# Patient Record
Sex: Male | Born: 1960 | Race: Black or African American | Hispanic: No | Marital: Married | Smoking: Current every day smoker
Health system: Southern US, Community
[De-identification: ages and names within clinical notes are randomized; demographics above are authoritative.]

## PROBLEM LIST (undated history)

## (undated) DIAGNOSIS — K2950 Unspecified chronic gastritis without bleeding: Secondary | ICD-10-CM

## (undated) DIAGNOSIS — K5730 Diverticulosis of large intestine without perforation or abscess without bleeding: Secondary | ICD-10-CM

## (undated) DIAGNOSIS — E785 Hyperlipidemia, unspecified: Secondary | ICD-10-CM

## (undated) HISTORY — PX: KNEE ARTHROSCOPY: SUR90

## (undated) HISTORY — DX: Diverticulosis of large intestine without perforation or abscess without bleeding: K57.30

## (undated) HISTORY — DX: Unspecified chronic gastritis without bleeding: K29.50

## (undated) HISTORY — DX: Hyperlipidemia, unspecified: E78.5

## (undated) HISTORY — PX: ESOPHAGOGASTRODUODENOSCOPY: SHX1529

---

## 2000-09-24 ENCOUNTER — Emergency Department (HOSPITAL_COMMUNITY): Admission: EM | Admit: 2000-09-24 | Discharge: 2000-09-24 | Payer: Self-pay | Admitting: Emergency Medicine

## 2004-03-15 ENCOUNTER — Other Ambulatory Visit: Payer: Self-pay

## 2007-07-09 ENCOUNTER — Emergency Department (HOSPITAL_COMMUNITY): Admission: EM | Admit: 2007-07-09 | Discharge: 2007-07-09 | Payer: Self-pay | Admitting: Emergency Medicine
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
## 2008-06-21 ENCOUNTER — Emergency Department (HOSPITAL_COMMUNITY): Admission: EM | Admit: 2008-06-21 | Discharge: 2008-06-21 | Payer: Self-pay | Admitting: Emergency Medicine
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
## 2008-11-09 ENCOUNTER — Inpatient Hospital Stay (HOSPITAL_COMMUNITY): Admission: RE | Admit: 2008-11-09 | Discharge: 2008-11-11 | Payer: Self-pay | Admitting: Psychiatry

## 2008-11-09 ENCOUNTER — Ambulatory Visit: Payer: Self-pay | Admitting: Psychiatry

## 2008-11-09 ENCOUNTER — Emergency Department (HOSPITAL_COMMUNITY): Admission: EM | Admit: 2008-11-09 | Discharge: 2008-11-09 | Payer: Self-pay | Admitting: Emergency Medicine

## 2008-11-25 ENCOUNTER — Emergency Department (HOSPITAL_COMMUNITY): Admission: EM | Admit: 2008-11-25 | Discharge: 2008-11-25 | Payer: Self-pay | Admitting: Emergency Medicine

## 2011-02-02 LAB — RAPID URINE DRUG SCREEN, HOSP PERFORMED
Benzodiazepines: NOT DETECTED
Cocaine: POSITIVE — AB

## 2011-02-02 LAB — DIFFERENTIAL
Basophils Absolute: 0 10*3/uL (ref 0.0–0.1)
Basophils Relative: 0 % (ref 0–1)
Eosinophils Absolute: 0.5 10*3/uL (ref 0.0–0.7)
Eosinophils Relative: 5 % (ref 0–5)
Lymphocytes Relative: 30 % (ref 12–46)

## 2011-02-02 LAB — CBC
HCT: 43.2 % (ref 39.0–52.0)
Platelets: 343 10*3/uL (ref 150–400)
RDW: 14.2 % (ref 11.5–15.5)

## 2011-02-02 LAB — POCT I-STAT, CHEM 8
HCT: 46 % (ref 39.0–52.0)
Hemoglobin: 15.6 g/dL (ref 13.0–17.0)
Sodium: 142 mEq/L (ref 135–145)
TCO2: 27 mmol/L (ref 0–100)

## 2011-02-02 LAB — ETHANOL: Alcohol, Ethyl (B): <5 mg/dL (ref 0–10)

## 2011-03-03 NOTE — H&P (Signed)
Darrell Wyatt NO.:  [id number]   MEDICAL RECORD NO.:  [id number]          PATIENT TYPE:  IPS   LOCATION:  0501                          FACILITY:  [facility]   PHYSICIAN:  Geoffery Lyons, M.D.      DATE OF BIRTH:  10-May-1961   DATE OF ADMISSION:  11/09/2008  DATE OF DISCHARGE:                       PSYCHIATRIC ADMISSION ASSESSMENT   HISTORY:  This is a voluntary admission for this 50 year old married  African American male who presented to the emergency department at [facility].  He reported that he had fallen off the wagon,  and he was afraid he might hurt himself.  He indicated in the emergency  room that he had just had a two-day relapse on cocaine.  In fact, it has  actually been closer to three months.  Yesterday he felt like he might  drive his truck off a cliff.  This was due to the fact that he has been  working at the [employer] for about five years.  He missed work the  other day because of using crack, and he felt extremely guilty.  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   PAST PSYCHIATRIC HISTORY:  1. In 1996, he went to something called [facility], in [place].  This      was to help him get off of crack.  He stayed clean he says for      seven years.  2. In 2005, he relapsed again.  He went back to [facility].  3. He relapsed again about three months ago.   SOCIAL HISTORY:  He finished high school.  He has been married once.  He  has three children by his wife, two boys ages 17 and 52 and a daughter  age 42.  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   FAMILY HISTORY:  He denies alcohol and drug history.  He has had a  recent relapse on the crack.   PRIMARY CARE PHYSICIAN:  None.   PAST MEDICAL HISTORY:  None known.   MEDICATIONS:  He is not prescribed any.   ALLERGIES:  No  known drug allergies.   PHYSICAL EXAMINATION:  GENERAL:  He was medically cleared in the  emergency department at [facility].  He had no  remarkable findings on his workup.  VITAL SIGNS:  He is 71-1/2 inches tall, weight 204 pounds, temperature  97.9 degrees, blood pressure 151/71, pulse 70, respirations 18.  EXTREMITIES:  He has a scar on his right knee.  He had knee surgery at  age 52.  MENTAL STATUS EXAM:  Today he is alert and oriented.  He was  appropriately groomed, dressed and nourished.  His speech is a normal  rate, rhythm and tone.  His mood was appropriate to the situation.  His  thought processes are clear, rational and goal-oriented.  He knows he  needs to get back with his regular support group, etc.  Apparently his  sponsor is dying of cancer, and that was another thing that contributed  to his relapse.  He does not feel actively suicidal today, but he is not  ready for discharge yet.  He is not homicidal and he denies auditory or  visual hallucinations.   LABORATORY DATA:  He had no alcohol.  His labs showed no abnormalities,  other than the presence of cocaine.   DIAGNOSIS:  AXIS I.  Cocaine abuse.  AXIS II.  Deferred.  AXIS III.  None.  AXIS IV.  He does have a traffic court date on this upcoming Wednesday.  AXIS V.  34.   PLAN:  Is to admit for safety and stabilization.  Help him work with the  substance abuse people to help identify a new sponsor and he refuses any  antidepressant or antianxiety agents.  He denies a need for these.   The estimated length of stay is two to three days.      Darrell Wyatt, P.A.-C.      Geoffery Lyons, M.D.  Electronically Signed    MD/MEDQ  D:  11/10/2008  T:  11/10/2008  Job:  [id number]

## 2011-03-06 NOTE — Discharge Summary (Signed)
Darrell Wyatt, BANKHEAD NO.:  [id number]   MEDICAL RECORD NO.:  [id number]          PATIENT TYPE:  IPS   LOCATION:  0501                          FACILITY:  [facility]   PHYSICIAN:  Jasmine Pang, M.D. DATE OF BIRTH:  1961-08-20   DATE OF ADMISSION:  11/09/2008  DATE OF DISCHARGE:  11/11/2008                               DISCHARGE SUMMARY   IDENTIFICATION:  This is a voluntary admission for this 50 year old  married African American male, who was admitted on November 09, 2008.   HISTORY OF PRESENT ILLNESS:  The patient presented to the emergency  department at [facility].  He reported that he had  fallen off the wagon and he was afraid he might hurt himself.  He  indicated in the emergency room that he had just had a 2-day relapse on  cocaine, in fact it has actually been closer to 3 months.  Yesterday, he  felt like he might drive his truck off a cliff.  This was due to the  fact that he has been working at the [employer] for about 5 years and  missed work the other day because of crack.  He likes his job and he  said he felt extremely guilty.  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   PAST PSYCHIATRIC HISTORY:  In 1996, he went to see something called the  [facility] in [place].  This was helped him to get off crack.  He stayed  clean, he states for 7 years.  In 2005, he relapsed again.  He went to a  [facility] and then he relapsed about 3 months ago.   FAMILY HISTORY:  The patient denies alcohol and drug history in the  family.   PAST MEDICAL HISTORY:  None known.   MEDICATIONS:  None.   ALLERGIES:  No known drug allergies.   PHYSICAL FINDINGS:  The patient was medically cleared in the emergency  department at [facility].  There were no acute physical or medical  problems noted.   HOSPITAL COURSE:  Upon admission, the patient was started on trazodone  50 mg p.o.  q.h.s. p.r.n. insomnia and Symmetrel 100 mg p.o. b.i.d. for  craving from cocaine.  In individual sessions, he was friendly and  cooperative.  He states he felt he got depressed, but he was coming  down off the cocaine.  He states he enjoys his job and wants to get  back to work as soon as possible.  He stated he knows what he has to do  I just needed to get that messed out of my system.  He stated he  needed to work on school and going to his [employer]  He says this has  been a real wake up call for me.  On November 11, 2008, mental status  was improved.  The patient was less depressed, less anxious.  Affect was  consistent with mood.  There was no suicidal or homicidal ideation.  No  thoughts of self-injurious behavior.  No auditory  or visual  hallucinations.  No paranoia or delusions.  Thoughts were logical and  goal-directed.  Thought content no predominant theme.  Cognitive was  grossly intact.  Insight good.  Judgment good.  Impulse control was  good.  It was felt the patient was ready for discharge.   DISCHARGE DIAGNOSES:  Axis I:  Cocaine dependence with recent relapse.  Axis II:  None.  Axis III:  None.  Axis IV:  Moderate (he does have a traffic court date on this upcoming  Wednesday).  Axis V:  Global assessment of functioning was 50 at discharge.  GAF was  34 upon admission.  GAF highest past year was 65 to 70.   DISCHARGE PLANS:  There was no specific activity level or dietary  restrictions.   POSTHOSPITAL CARE PLANS:  The patient will be involved in the 12-step  support group.  He was also given the number of [facility] to call for an appointment if he felt he needed  psychiatric help in addition to his recovery program.   DISCHARGE MEDICATIONS:  Symmetrel 100 mg p.o. b.i.d. for cocaine  craving.      Jasmine Pang, M.D.  Electronically Signed     BHS/MEDQ  D:  12/02/2008  T:  12/03/2008  Job:  [id number]

## 2011-03-20 ENCOUNTER — Emergency Department (HOSPITAL_COMMUNITY)
Admission: EM | Admit: 2011-03-20 | Discharge: 2011-03-20 | Disposition: A | Discharge status: Home / Self Care | Payer: Self-pay | Attending: Emergency Medicine | Admitting: Emergency Medicine

## 2011-03-20 DIAGNOSIS — A5903 Trichomonal cystitis and urethritis: Secondary | ICD-10-CM | POA: Insufficient documentation

## 2011-06-16 ENCOUNTER — Inpatient Hospital Stay (INDEPENDENT_AMBULATORY_CARE_PROVIDER_SITE_OTHER)
Admission: RE | Admit: 2011-06-16 | Discharge: 2011-06-16 | Disposition: A | Discharge status: Home / Self Care | Payer: Self-pay | Source: Ambulatory Visit | Attending: Emergency Medicine | Admitting: Emergency Medicine

## 2011-06-16 DIAGNOSIS — S335XXA Sprain of ligaments of lumbar spine, initial encounter: Secondary | ICD-10-CM

## 2011-08-30 ENCOUNTER — Emergency Department (INDEPENDENT_AMBULATORY_CARE_PROVIDER_SITE_OTHER): Payer: Self-pay

## 2011-08-30 ENCOUNTER — Encounter: Payer: Self-pay | Admitting: *Deleted

## 2011-08-30 ENCOUNTER — Emergency Department (INDEPENDENT_AMBULATORY_CARE_PROVIDER_SITE_OTHER)
Admission: EM | Admit: 2011-08-30 | Discharge: 2011-08-30 | Disposition: A | Discharge status: Other Acute Inpatient Hospital | Payer: Self-pay | Source: Home / Self Care | Attending: Family Medicine | Admitting: Family Medicine

## 2011-08-30 DIAGNOSIS — M549 Dorsalgia, unspecified: Secondary | ICD-10-CM

## 2011-08-30 LAB — POCT URINALYSIS DIP (DEVICE)
Bilirubin Urine: NEGATIVE
Glucose, UA: NEGATIVE mg/dL
Leukocytes, UA: NEGATIVE
Nitrite: NEGATIVE

## 2011-08-30 IMAGING — CR DG ABDOMEN 1V
2 series · 2 of 2 positions shown · non-contrast
Comparison: Spine films [DATE]

CLINICAL DATA: Left flank pain and back

ABDOMEN - 1 VIEW

[view not recorded (1 of 2)]
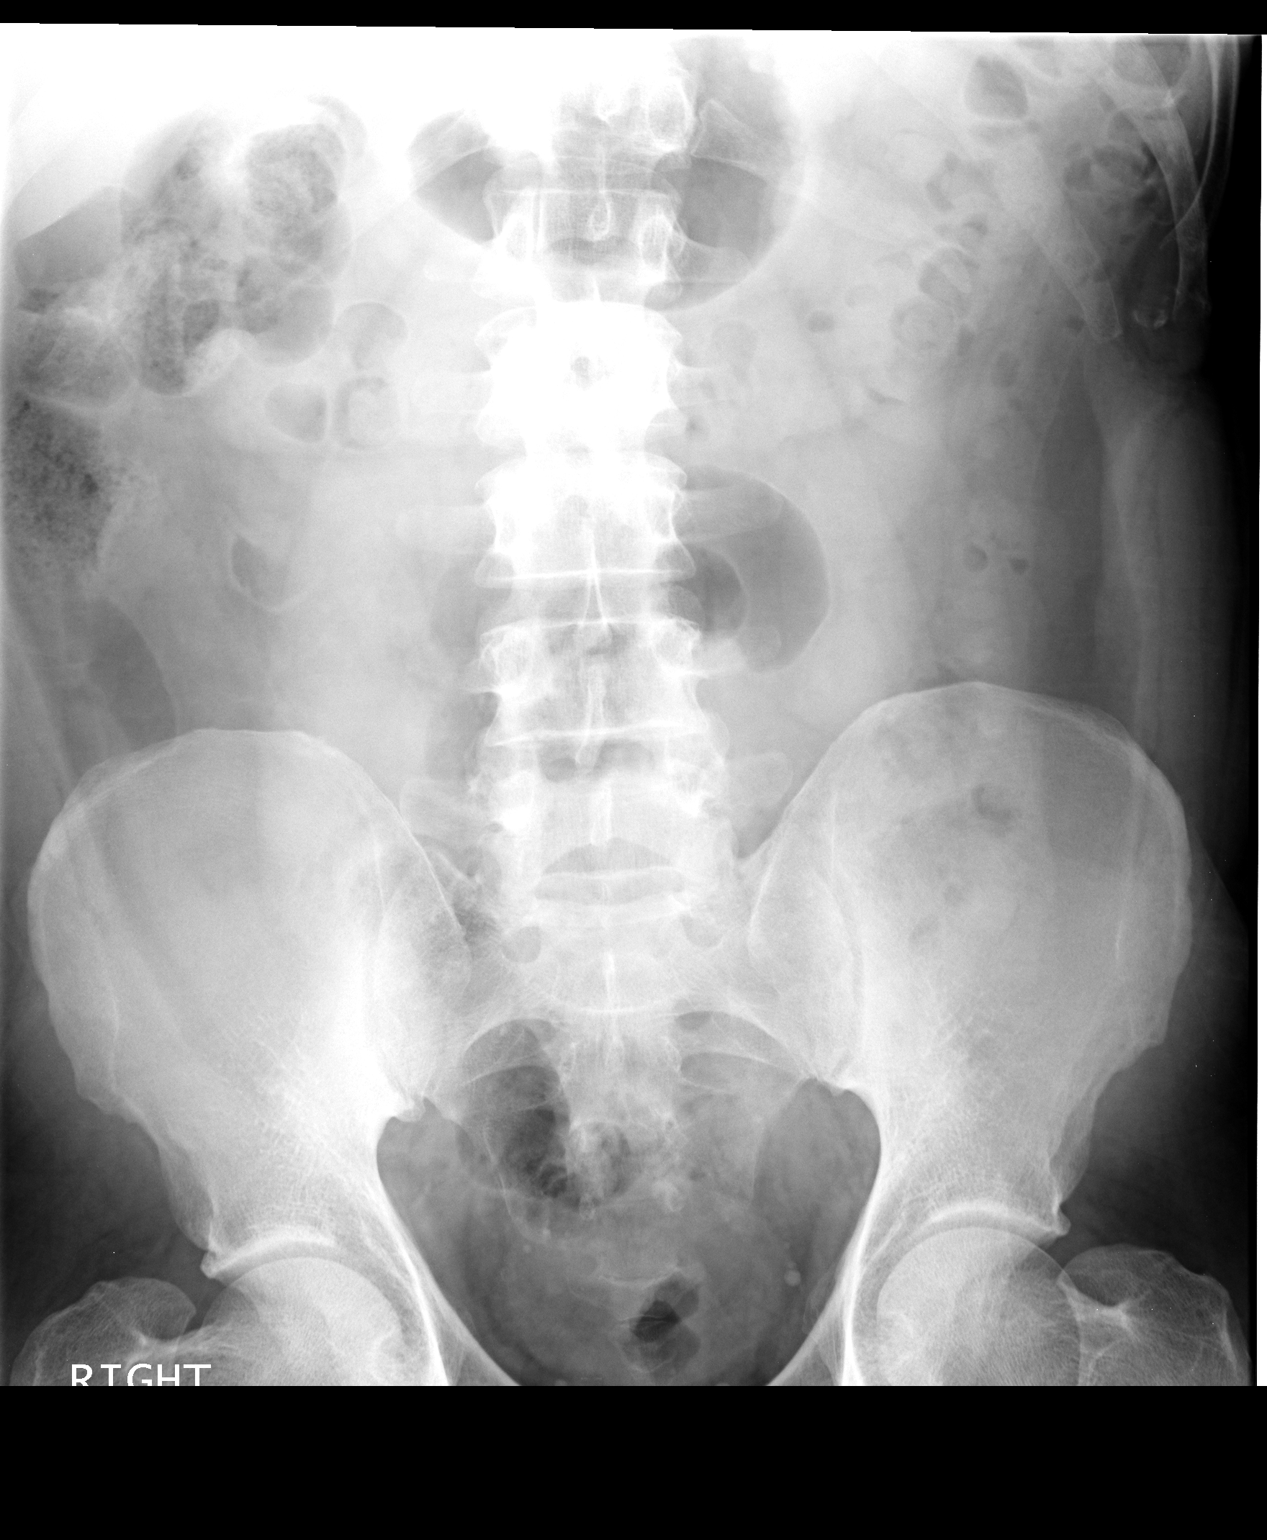

[view not recorded (2 of 2)]
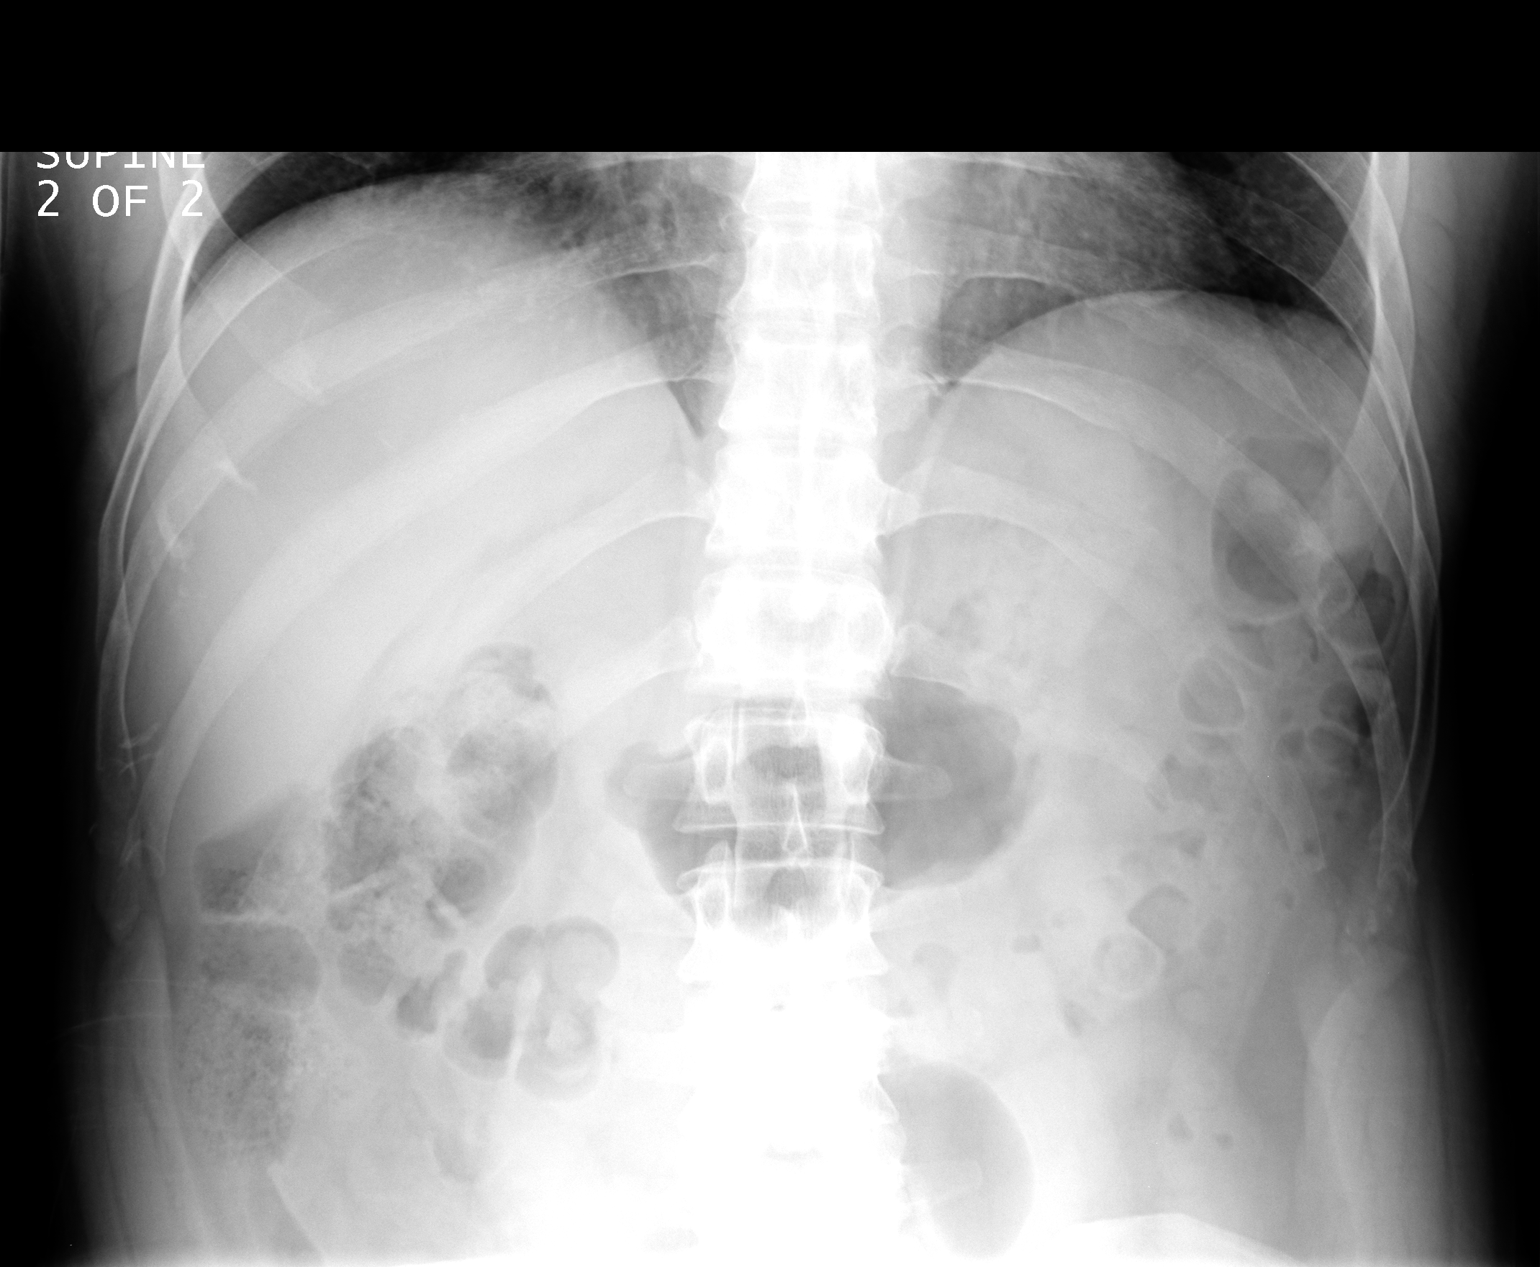

[2 of 2 positions shown; findings below may reference images not displayed]

FINDINGS: There are no dilated loops of large or small bowel.  No
pathologic calcifications are evident.  No osseous abnormality.
IMPRESSION: No acute abdominal process.

## 2011-08-30 IMAGING — CR DG THORACIC SPINE 3V
3 series · 3 of 3 positions shown · non-contrast
Comparison: None.

CLINICAL DATA: Back pain

THORACIC SPINE - 2 VIEW + SWIMMERS

[view not recorded (1 of 3)]
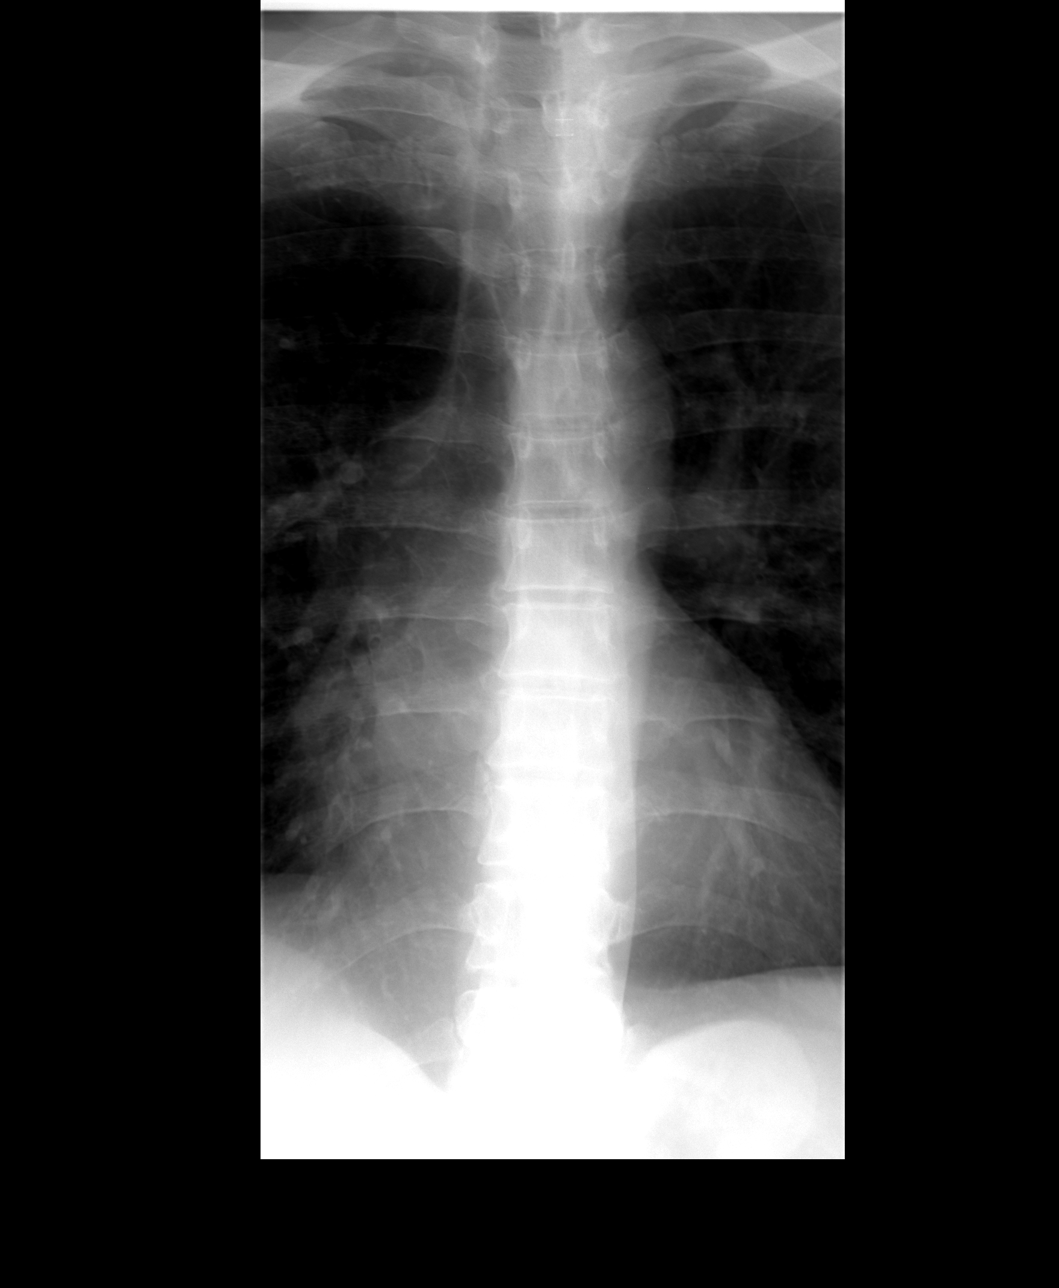

[view not recorded (2 of 3)]
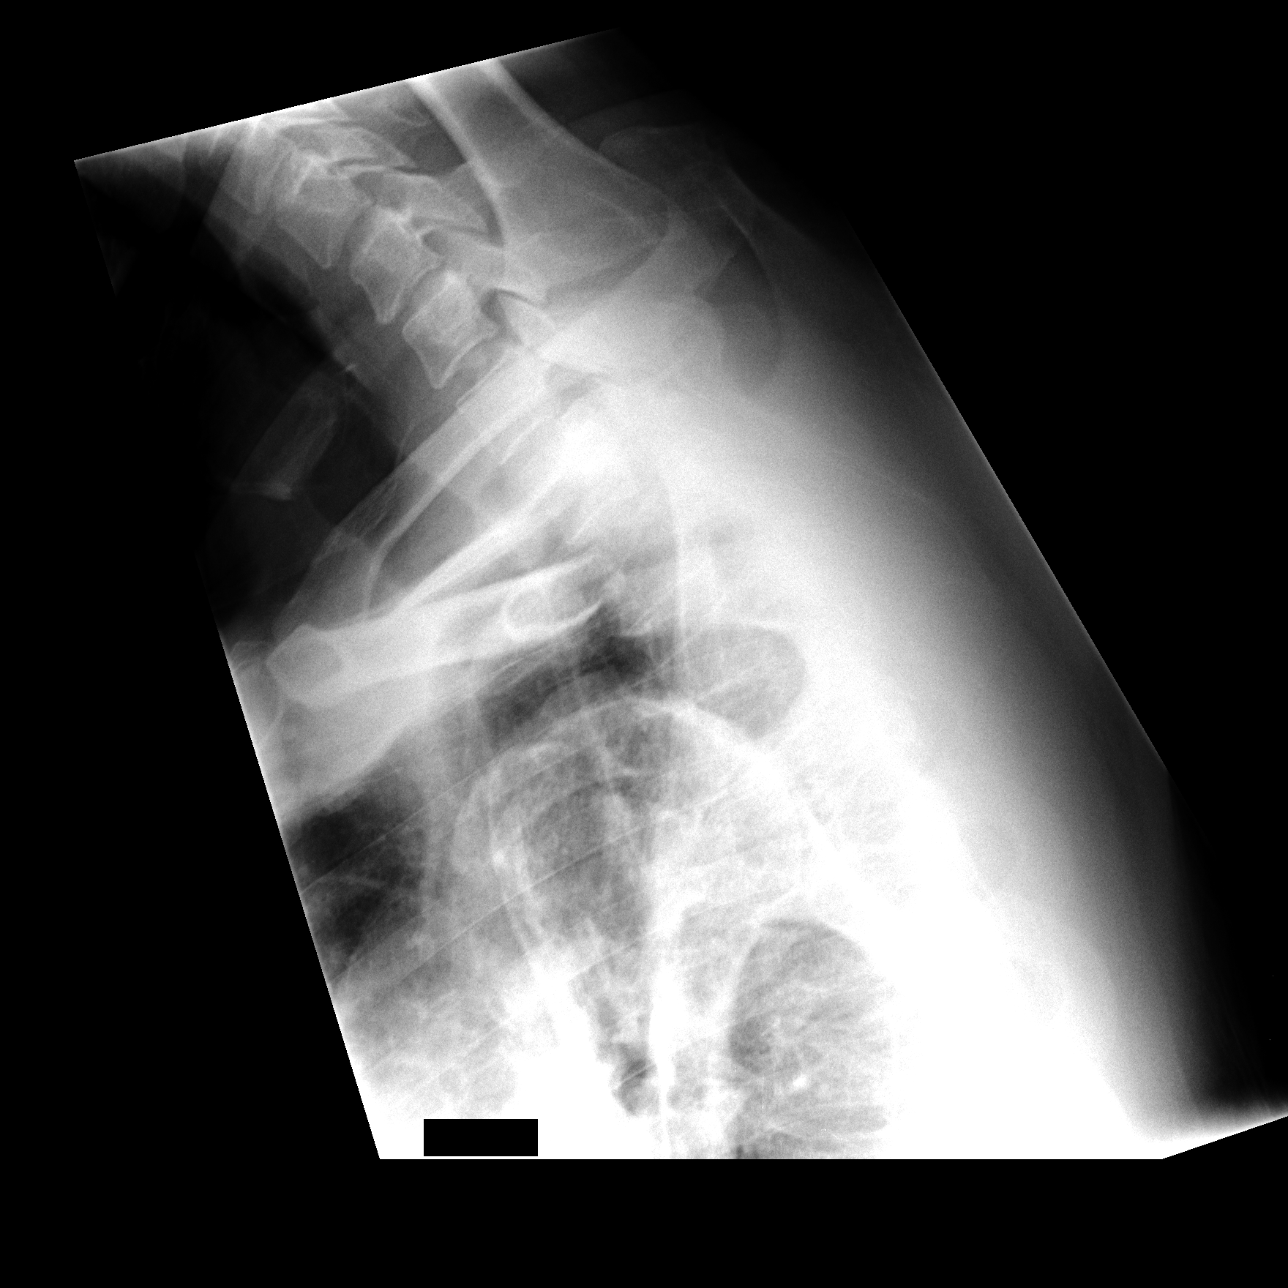

[view not recorded (3 of 3)]
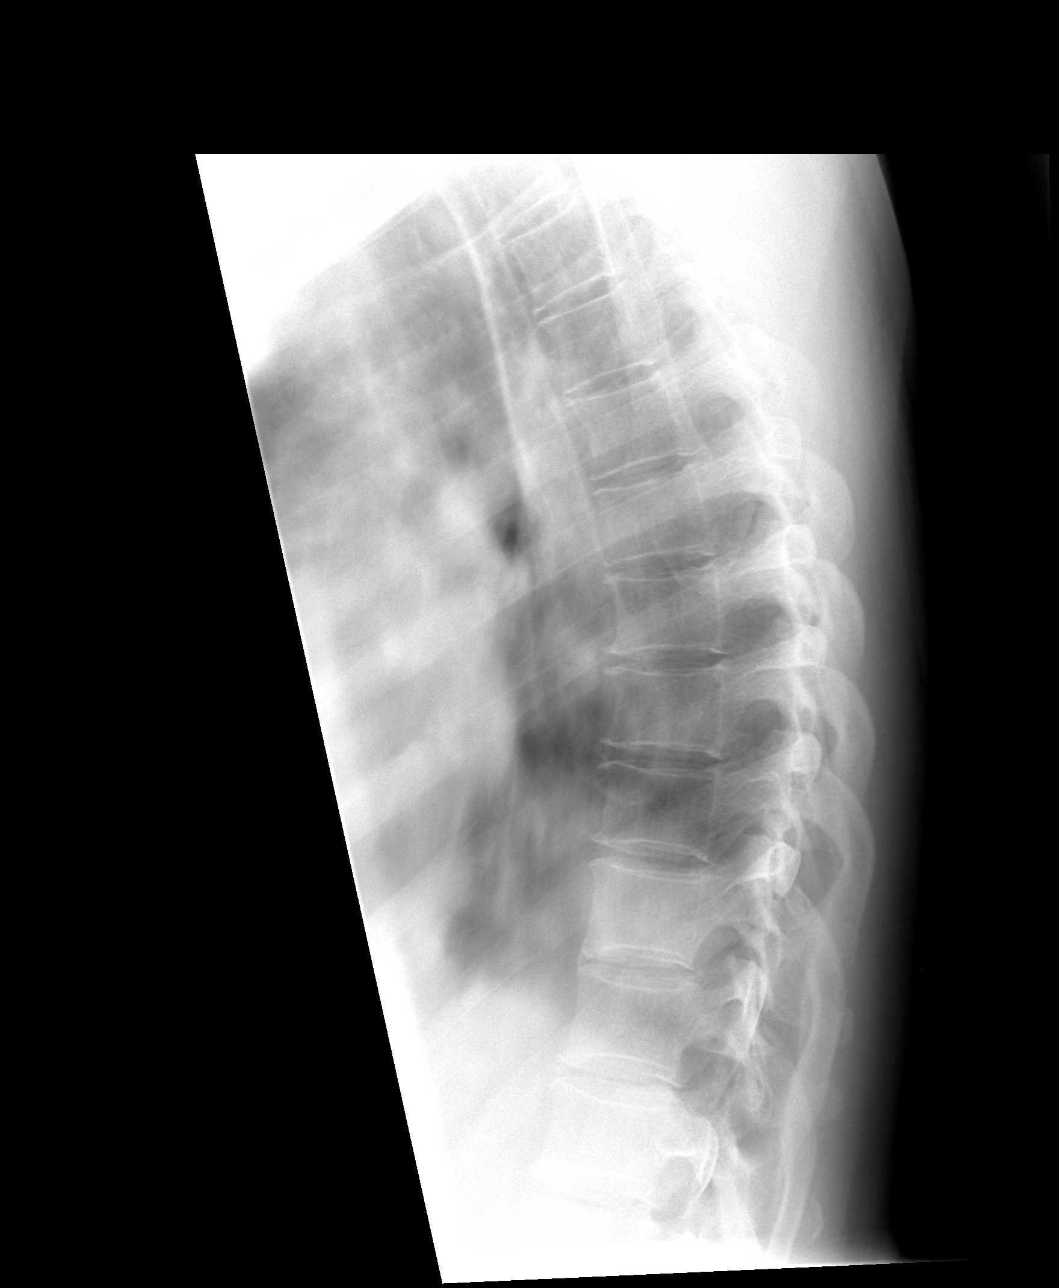

[3 of 3 positions shown; findings below may reference images not displayed]

FINDINGS: Thoracic spine is normal in alignment and position.

No evidence of fracture or dislocation.  The vertebral body heights
are maintained.

Mild multilevel degenerative changes.

The visualized lungs are clear.
IMPRESSION: No fracture or dislocation is seen.

Mild multilevel degenerative changes.

## 2011-08-30 MED ORDER — CYCLOBENZAPRINE HCL 5 MG PO TABS: 5.0000 mg | ORAL_TABLET | Freq: Three times a day (TID) | ORAL | Status: AC | PRN

## 2011-08-30 MED ORDER — NAPROXEN 500 MG PO TABS: 500.0000 mg | ORAL_TABLET | Freq: Two times a day (BID) | ORAL | Status: AC

## 2011-08-30 MED ORDER — HYDROCODONE-ACETAMINOPHEN 5-325 MG PO TABS: ORAL_TABLET | ORAL | Status: AC

## 2011-08-30 NOTE — ED Provider Notes (Addendum)
History     CSN: [id number] Arrival date & time: 08/30/2011  9:28 AM   First MD Initiated Contact with Patient 08/30/11 [phone]      Chief Complaint  Patient presents with  . Flank Pain    pt with c/o left sided back/flank/abdominal pain - denies urinary symptoms - denies constipation - pain increases with movement - unable to sleep at night due to pain x one week   . Abdominal Pain    (Consider location/radiation/quality/duration/timing/severity/associated sxs/prior treatment) Patient is a 50 y.o. male presenting with back pain. The history is provided by the patient.  Back Pain  This is a new problem. The current episode started more than 2 days ago. The problem occurs constantly. The problem has not changed since onset.The pain is associated with no known injury. The pain is present in the lumbar spine. The pain is moderate. The symptoms are aggravated by bending and twisting. Associated symptoms include abdominal pain. Pertinent negatives include no bowel incontinence, no perianal numbness, no bladder incontinence, no paresthesias, no paresis and no tingling.    History reviewed. No pertinent past medical history.  Past Surgical History  Procedure Date  . Knee arthroscopy     History reviewed. No pertinent family history.  History  Substance Use Topics  . Smoking status: Current Everyday Smoker  . Smokeless tobacco: Not on file  . Alcohol Use: No      Review of Systems  Constitutional: Negative.   HENT: Negative.   Eyes: Negative.   Respiratory: Negative.   Cardiovascular: Negative.   Gastrointestinal: Positive for abdominal pain. Negative for diarrhea, constipation and bowel incontinence.  Genitourinary: Negative for bladder incontinence.  Musculoskeletal: Positive for back pain.  Skin: Negative.   Neurological: Negative for tingling and paresthesias.    Allergies  Review of patient's allergies indicates no known allergies.  Home Medications   Current  Outpatient Rx  Name Route Sig Dispense Refill  . IBUPROFEN 800 MG PO TABS Oral Take 800 mg by mouth every 8 (eight) hours as needed.        BP 154/94  Pulse 71  Temp(Src) 98.8 F (37.1 C) (Oral)  Resp 16  SpO2 99%  Physical Exam  Constitutional: He is oriented to person, place, and time. He appears well-developed and well-nourished.  HENT:  Head: Normocephalic and atraumatic.  Eyes: EOM are normal.  Abdominal: Soft. He exhibits no distension. There is tenderness in the left upper quadrant. There is no rebound, no guarding, no CVA tenderness, no tenderness at McBurney's point and negative Murphy's sign.    Musculoskeletal:       Lumbar back: He exhibits tenderness, pain and spasm. He exhibits normal range of motion and no bony tenderness.       Back:  Neurological: He is alert and oriented to person, place, and time.    ED Course  Procedures (including critical care time)  Labs Reviewed  POCT URINALYSIS DIP (DEVICE) - Abnormal; Notable for the following:    Hgb urine dipstick TRACE (*)    All other components within normal limits  POCT URINALYSIS DIPSTICK   No results found.   No diagnosis found.    MDM  Labs reviewed; xray reviewed; no acute findings.        Richardo Priest, MD 08/30/11 1145  Renaee Munda, MD 02/18/12 1534

## 2011-08-30 NOTE — ED Notes (Signed)
Dr Juanetta Gosling in talking with pt

## 2012-01-24 ENCOUNTER — Emergency Department (INDEPENDENT_AMBULATORY_CARE_PROVIDER_SITE_OTHER)
Admission: EM | Admit: 2012-01-24 | Discharge: 2012-01-24 | Disposition: A | Discharge status: Home / Self Care | Payer: Self-pay | Source: Home / Self Care | Attending: Emergency Medicine | Admitting: Emergency Medicine

## 2012-01-24 ENCOUNTER — Encounter (HOSPITAL_COMMUNITY): Payer: Self-pay | Admitting: *Deleted

## 2012-01-24 DIAGNOSIS — K047 Periapical abscess without sinus: Secondary | ICD-10-CM

## 2012-01-24 MED ORDER — PENICILLIN V POTASSIUM 500 MG PO TABS: 500.0000 mg | ORAL_TABLET | Freq: Three times a day (TID) | ORAL | Status: AC

## 2012-01-24 MED ORDER — HYDROCODONE-ACETAMINOPHEN 5-325 MG PO TABS: 2.0000 | ORAL_TABLET | ORAL | Status: AC | PRN

## 2012-01-24 NOTE — Discharge Instructions (Signed)
Abscessed Tooth A tooth abscess is a collection of infected fluid (pus) from a bacterial infection in the inner part of the tooth (pulp). It usually occurs at the end of the tooth's root.  CAUSES   A very bad cavity (extensive tooth decay).   Trauma to the tooth, such as a broken or chipped tooth, that allows bacteria to enter into the pulp.  SYMPTOMS  Severe pain in and around the infected tooth.   Swelling and redness around the abscessed tooth or in the mouth or face.   Tenderness.   Pus drainage.   Bad breath.   Bitter taste in the mouth.   Difficulty swallowing.   Difficulty opening the mouth.   Feeling sick to your stomach (nauseous).   Vomiting.   Chills.   Swollen neck glands.  DIAGNOSIS  A medical and dental history will be taken.   An examination will be performed by tapping on the abscessed tooth.   X-rays may be taken of the tooth to identify the abscess.  TREATMENT The goal of treatment is to eliminate the infection.   You may be prescribed antibiotic medicine to stop the infection from spreading.   A root canal may be performed to save the tooth. If the tooth cannot be saved, it may be pulled (extracted) and the abscess may be drained.  HOME CARE INSTRUCTIONS  Only take over-the-counter or prescription medicines for pain, fever, or discomfort as directed by your caregiver.   Do not drive after taking pain medicine (narcotics).   Rinse your mouth (gargle) often with salt water ( tsp salt in 8 oz of warm water) to relieve pain or swelling.   Do not apply heat to the outside of your face.   Return to your dentist for further treatment as directed.  SEEK IMMEDIATE DENTAL CARE IF:  You have a temperature by mouth above 102 F (38.9 C), not controlled by medicine.   You have chills or a very bad headache.   You have problems breathing or swallowing.   Your have trouble opening your mouth.   You develop swelling in the neck or around the eye.    Your pain is not helped by medicine.   Your pain is getting worse instead of better.  Document Released: 10/05/2005 Document Revised: 09/24/2011 Document Reviewed: 01/13/2011 [facility] Patient Information 2012 [place], [place].Abscessed Tooth A tooth abscess is a collection of infected fluid (pus) from a bacterial infection in the inner part of the tooth (pulp). It usually occurs at the end of the tooth's root.  CAUSES   A very bad cavity (extensive tooth decay).   Trauma to the tooth, such as a broken or chipped tooth, that allows bacteria to enter into the pulp.  SYMPTOMS  Severe pain in and around the infected tooth.   Swelling and redness around the abscessed tooth or in the mouth or face.   Tenderness.   Pus drainage.   Bad breath.   Bitter taste in the mouth.   Difficulty swallowing.   Difficulty opening the mouth.   Feeling sick to your stomach (nauseous).   Vomiting.   Chills.   Swollen neck glands.  DIAGNOSIS  A medical and dental history will be taken.   An examination will be performed by tapping on the abscessed tooth.   X-rays may be taken of the tooth to identify the abscess.  TREATMENT The goal of treatment is to eliminate the infection.   You may be prescribed antibiotic medicine to stop  the infection from spreading.   A root canal may be performed to save the tooth. If the tooth cannot be saved, it may be pulled (extracted) and the abscess may be drained.  HOME CARE INSTRUCTIONS  Only take over-the-counter or prescription medicines for pain, fever, or discomfort as directed by your caregiver.   Do not drive after taking pain medicine (narcotics).   Rinse your mouth (gargle) often with salt water ( tsp salt in 8 oz of warm water) to relieve pain or swelling.   Do not apply heat to the outside of your face.   Return to your dentist for further treatment as directed.  SEEK IMMEDIATE DENTAL CARE IF:  You have a temperature by mouth above  102 F (38.9 C), not controlled by medicine.   You have chills or a very bad headache.   You have problems breathing or swallowing.   Your have trouble opening your mouth.   You develop swelling in the neck or around the eye.   Your pain is not helped by medicine.   Your pain is getting worse instead of better.  Document Released: 10/05/2005 Document Revised: 09/24/2011 Document Reviewed: 01/13/2011 [facility] Patient Information 2012 [place], [place].

## 2012-01-24 NOTE — ED Notes (Signed)
Co pain and swelling of gum at right upper molar area, has been swelling x 1 wk, has taken ibuprofen with little improvement.

## 2012-01-24 NOTE — ED Provider Notes (Signed)
History     CSN: [id number]  Arrival date & time 01/24/12  7829   First MD Initiated Contact with Patient 01/24/12 [phone]      Chief Complaint  Patient presents with  . Oral Swelling    (Consider location/radiation/quality/duration/timing/severity/associated sxs/prior treatment) HPI Comments: Patient presents urgent care today complaining of ongoing upper gum pain and swelling that has been persistent for about a week. Patient haven't called his dentist that supplies him to present to urgent care. Denies any fevers. In mild facial swelling right upper zygomatic area.  Patient is a 51 y.o. male presenting with tooth pain. The history is provided by the patient.  Dental PainPrimary symptoms do not include mouth pain, fever or cough. The symptoms began 5 to 7 days ago. The symptoms are worsening.  Additional symptoms include: gum swelling. Additional symptoms do not include: facial swelling, drooling and fatigue.    History reviewed. No pertinent past medical history.  Past Surgical History  Procedure Date  . Knee arthroscopy     History reviewed. No pertinent family history.  History  Substance Use Topics  . Smoking status: Current Everyday Smoker  . Smokeless tobacco: Not on file  . Alcohol Use: No      Review of Systems  Constitutional: Negative for fever and fatigue.  HENT: Positive for dental problem. Negative for facial swelling, drooling and mouth sores.   Respiratory: Negative for cough.     Allergies  Review of patient's allergies indicates no known allergies.  Home Medications   Current Outpatient Rx  Name Route Sig Dispense Refill  . HYDROCODONE-ACETAMINOPHEN 5-325 MG PO TABS Oral Take 2 tablets by mouth every 4 (four) hours as needed for pain. 15 tablet 0  . IBUPROFEN 800 MG PO TABS Oral Take 800 mg by mouth every 8 (eight) hours as needed.      Marland Kitchen NAPROXEN 500 MG PO TABS Oral Take 1 tablet (500 mg total) by mouth 2 (two) times daily. 30 tablet 0  .  PENICILLIN V POTASSIUM 500 MG PO TABS Oral Take 1 tablet (500 mg total) by mouth 3 (three) times daily. 30 tablet 0    BP 145/88  Pulse 75  Temp(Src) 98.1 F (36.7 C) (Oral)  Resp 17  SpO2 97%  Physical Exam  Nursing note and vitals reviewed. Constitutional: He appears well-developed and well-nourished. No distress.  HENT:  Mouth/Throat: Mucous membranes are normal. No oral lesions. Abnormal dentition. Dental abscesses and dental caries present. No lacerations. No posterior oropharyngeal edema or tonsillar abscesses.  Pulmonary/Chest: Effort normal and breath sounds normal.  Neurological: He is alert.  Skin: Skin is warm. No rash noted. No erythema.    ED Course  Procedures (including critical care time)  Labs Reviewed - No data to display No results found.   1. Abscessed tooth       MDM   Severe gingival disease and potentially early abscess formation on the right upper molar       Jimmie Molly, MD 01/24/12 1712

## 2014-01-24 ENCOUNTER — Emergency Department (HOSPITAL_COMMUNITY)
Admission: EM | Admit: 2014-01-24 | Discharge: 2014-01-24 | Disposition: A | Discharge status: Home / Self Care | Payer: 59 | Source: Home / Self Care | Attending: Emergency Medicine | Admitting: Emergency Medicine

## 2014-01-24 ENCOUNTER — Encounter (HOSPITAL_COMMUNITY): Payer: Self-pay | Admitting: Emergency Medicine

## 2014-01-24 ENCOUNTER — Emergency Department (INDEPENDENT_AMBULATORY_CARE_PROVIDER_SITE_OTHER): Payer: 59

## 2014-01-24 DIAGNOSIS — K5900 Constipation, unspecified: Secondary | ICD-10-CM

## 2014-01-24 IMAGING — CR DG ABDOMEN 2V
2 series · 2 of 2 positions shown · non-contrast
Comparison: DG ABDOMEN 1V dated [DATE]

CLINICAL DATA: Lower abdominal pain with vomiting and diarrhea
since this morning.

EXAM:
ABDOMEN - 2 VIEW

[view not recorded (1 of 2)]
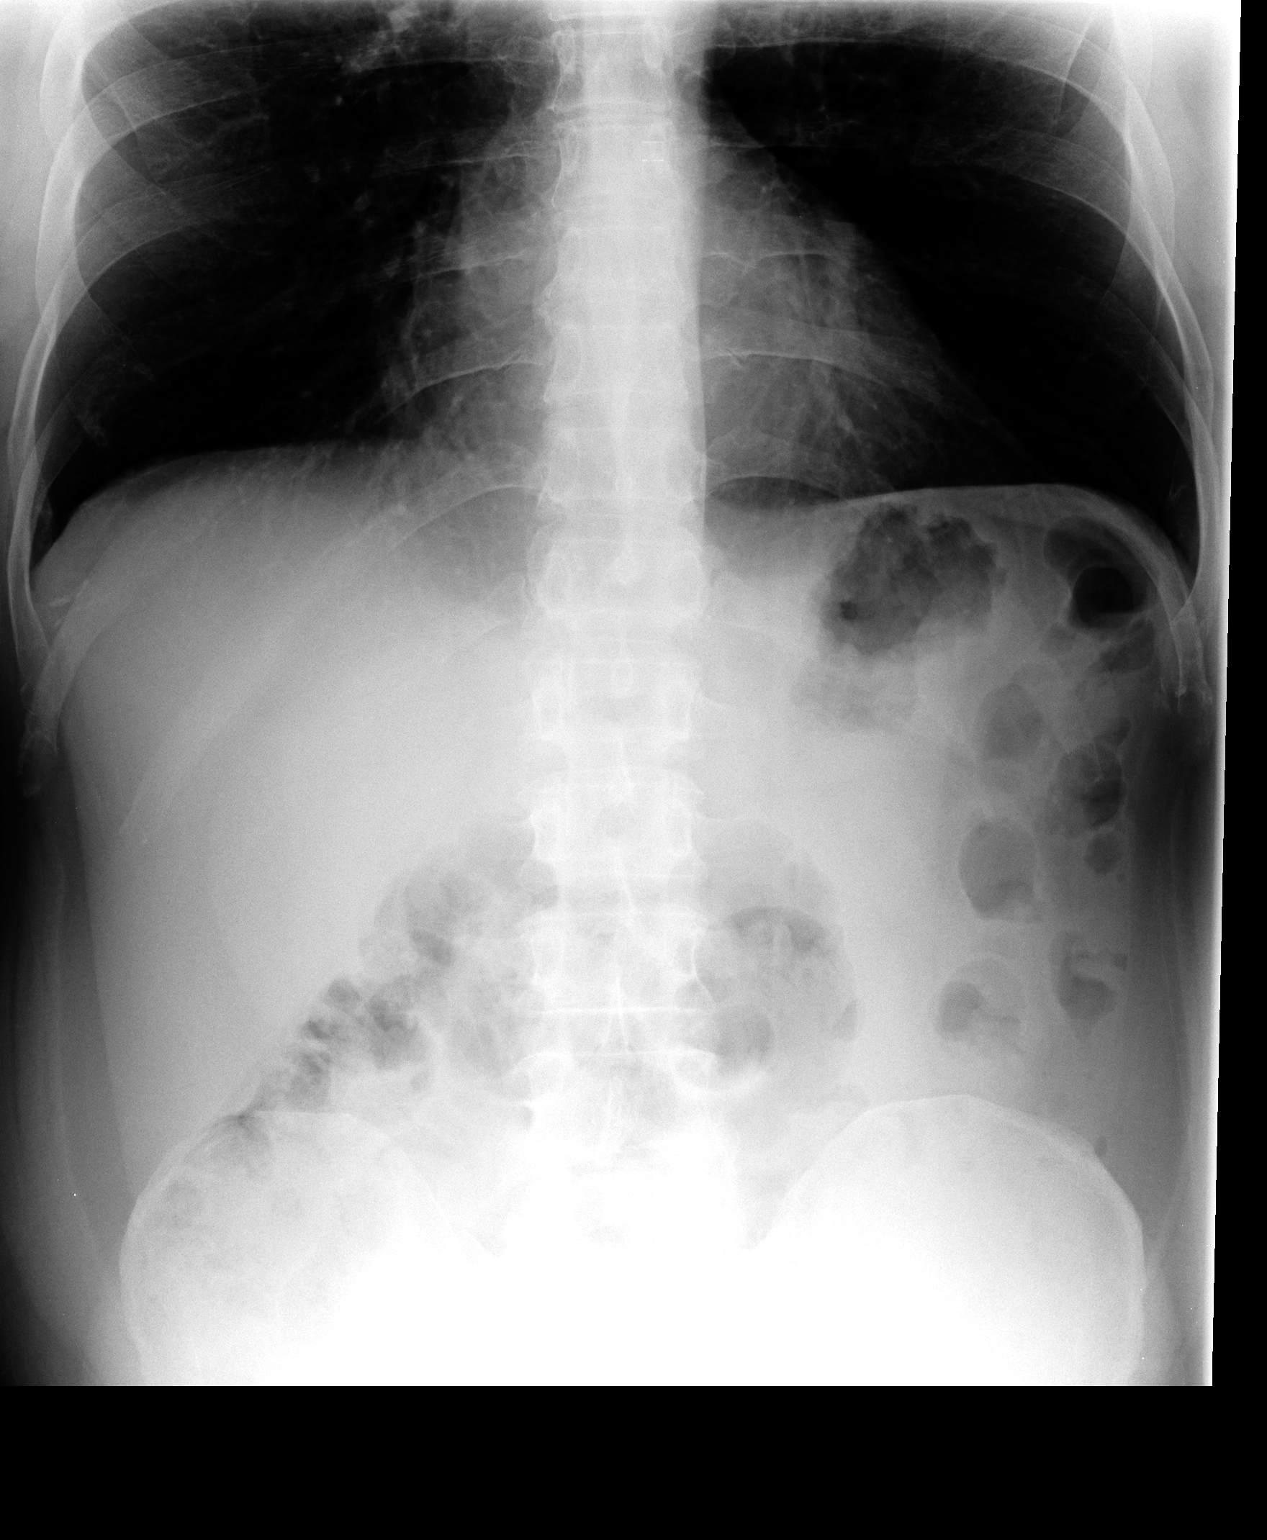

[view not recorded (2 of 2)]
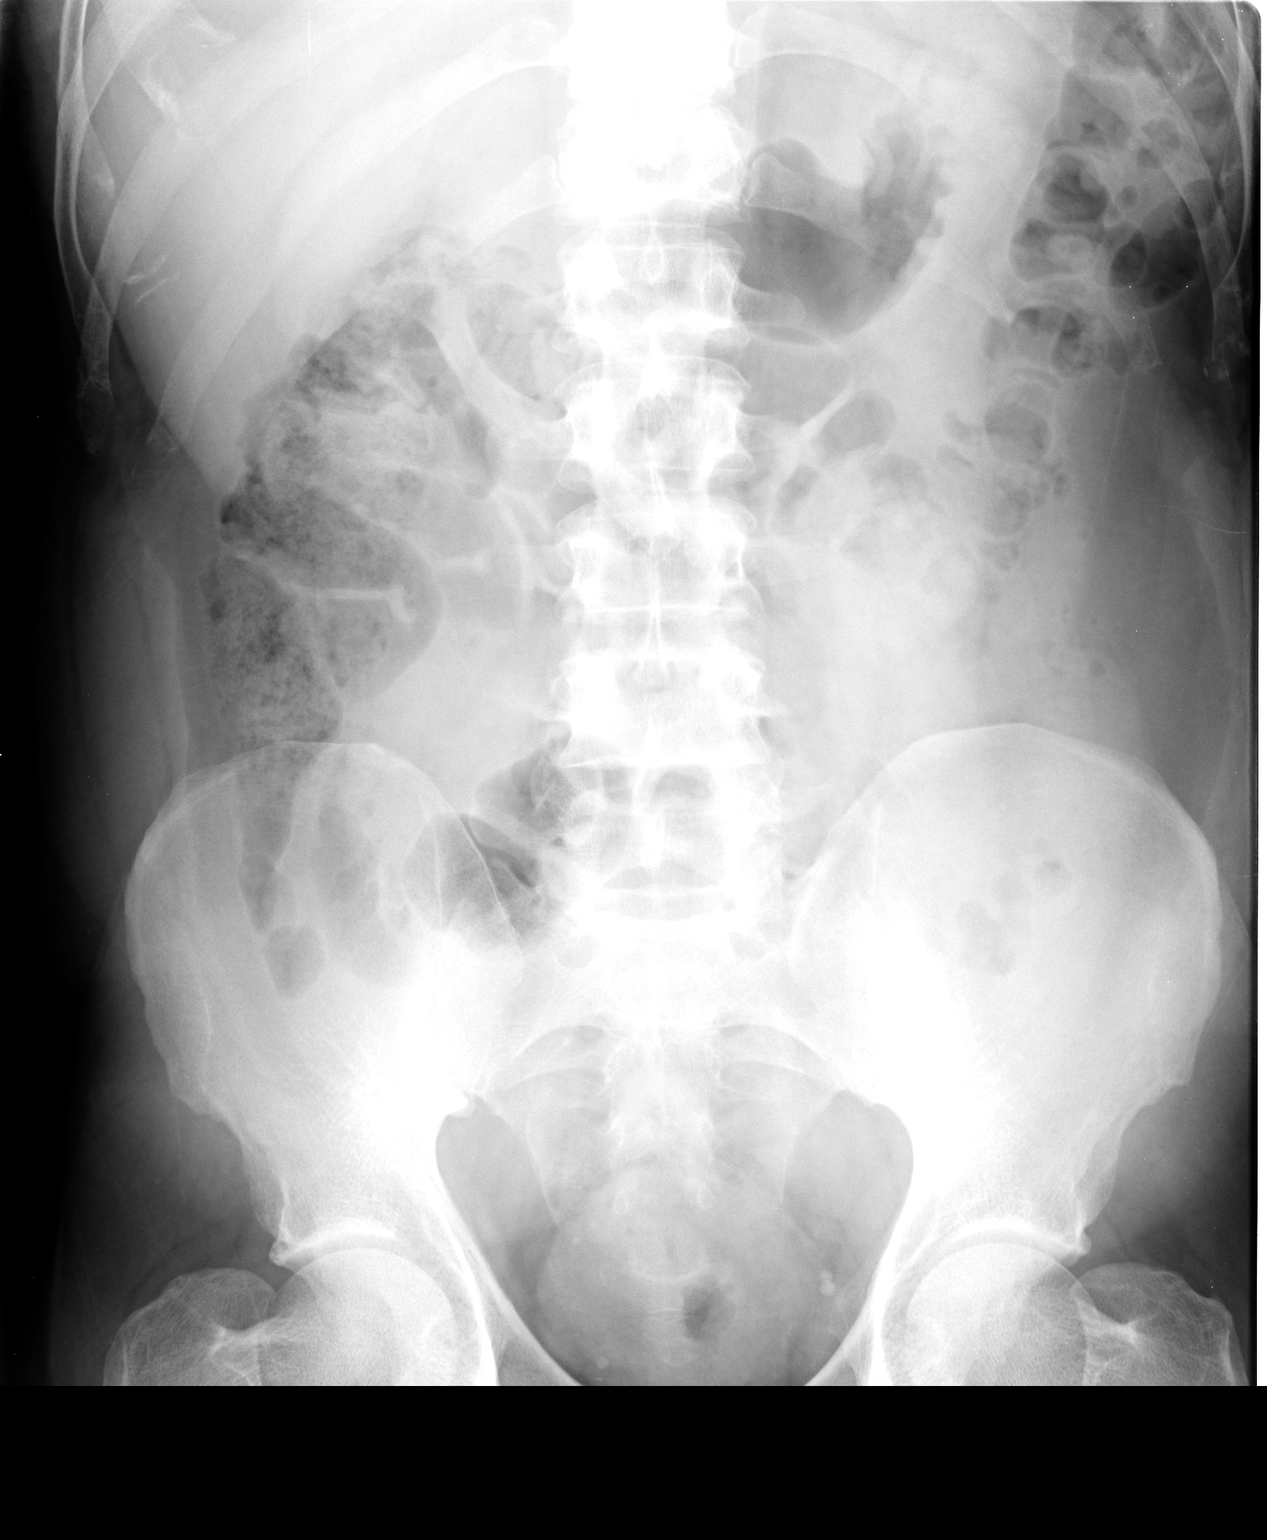

[2 of 2 positions shown; findings below may reference images not displayed]

FINDINGS: The bowel gas pattern is within the limits of normal. There is a
moderate stool burden in the right colon. There is no evidence of
small or large bowel obstruction or ileus. No abnormal soft tissue
calcifications are present. The bony structures are normal in
appearance. The lung bases are clear.
IMPRESSION: No acute intra-abdominal abnormality is demonstrated.

## 2014-01-24 MED ORDER — POLYETHYLENE GLYCOL 3350 17 G PO PACK: 17.0000 g | PACK | Freq: Every day | ORAL | Status: DC

## 2014-01-24 NOTE — Discharge Instructions (Signed)
Your xray suggests moderate constipation which is likely the reason for your discomfort this morning. Please use medication as prescribed to clear some of your stool burden. You should work towards finding a primary care doctor to assist you with routine health maintenance.  Constipation, Adult Constipation is when a person has fewer than 3 bowel movements a week; has difficulty having a bowel movement; or has stools that are dry, hard, or larger than normal. As people grow older, constipation is more common. If you try to fix constipation with medicines that make you have a bowel movement (laxatives), the problem may get worse. Long-term laxative use may cause the muscles of the colon to become weak. A low-fiber diet, not taking in enough fluids, and taking certain medicines may make constipation worse. CAUSES   Certain medicines, such as antidepressants, pain medicine, iron supplements, antacids, and water pills.   Certain diseases, such as diabetes, irritable bowel syndrome (IBS), thyroid disease, or depression.   Not drinking enough water.   Not eating enough fiber-rich foods.   Stress or travel.  Lack of physical activity or exercise.  Not going to the restroom when there is the urge to have a bowel movement.  Ignoring the urge to have a bowel movement.  Using laxatives too much. SYMPTOMS   Having fewer than 3 bowel movements a week.   Straining to have a bowel movement.   Having hard, dry, or larger than normal stools.   Feeling full or bloated.   Pain in the lower abdomen.  Not feeling relief after having a bowel movement. DIAGNOSIS  Your caregiver will take a medical history and perform a physical exam. Further testing may be done for severe constipation. Some tests may include:   A barium enema X-ray to examine your rectum, colon, and sometimes, your small intestine.  A sigmoidoscopy to examine your lower colon.  A colonoscopy to examine your entire  colon. TREATMENT  Treatment will depend on the severity of your constipation and what is causing it. Some dietary treatments include drinking more fluids and eating more fiber-rich foods. Lifestyle treatments may include regular exercise. If these diet and lifestyle recommendations do not help, your caregiver may recommend taking over-the-counter laxative medicines to help you have bowel movements. Prescription medicines may be prescribed if over-the-counter medicines do not work.  HOME CARE INSTRUCTIONS   Increase dietary fiber in your diet, such as fruits, vegetables, whole grains, and beans. Limit high-fat and processed sugars in your diet, such as [place] fries, hamburgers, cookies, candies, and soda.   A fiber supplement may be added to your diet if you cannot get enough fiber from foods.   Drink enough fluids to keep your urine clear or pale yellow.   Exercise regularly or as directed by your caregiver.   Go to the restroom when you have the urge to go. Do not hold it.  Only take medicines as directed by your caregiver. Do not take other medicines for constipation without talking to your caregiver first. [place] IF:   You have bright red blood in your stool.   Your constipation lasts for more than 4 days or gets worse.   You have abdominal or rectal pain.   You have thin, pencil-like stools.  You have unexplained weight loss. MAKE SURE YOU:   Understand these instructions.  Will watch your condition.  Will get help right away if you are not doing well or get worse. Document Released: 07/03/2004 Document Revised: 12/28/2011 Document  Reviewed: 07/17/2013 ExitCare Patient Information 2014 [place], [place].  Fiber Content in Foods Drinking plenty of fluids and consuming foods high in fiber can help with constipation. See the list below for the fiber content of some common foods. Starches and Grains / Dietary Fiber (g)  Cheerios, 1 cup / 3  g  Kellogg's Corn Flakes, 1 cup / 0.7 g  Rice Krispies, 1  cup / 0.3 g  Quaker Oat Life Cereal,  cup / 2.1 g  Oatmeal, instant (cooked),  cup / 2 g  Kellogg's Frosted Mini Wheats, 1 cup / 5.1 g  Rice, brown, long-grain (cooked), 1 cup / 3.5 g  Rice, white, long-grain (cooked), 1 cup / 0.6 g  Macaroni, cooked, enriched, 1 cup / 2.5 g Legumes / Dietary Fiber (g)  Beans, baked, canned, plain or vegetarian,  cup / 5.2 g  Beans, kidney, canned,  cup / 6.8 g  Beans, pinto, dried (cooked),  cup / 7.7 g  Beans, pinto, canned,  cup / 5.5 g Breads and Crackers / Dietary Fiber (g)  Graham crackers, plain or honey, 2 squares / 0.7 g  Saltine crackers, 3 squares / 0.3 g  Pretzels, plain, salted, 10 pieces / 1.8 g  Bread, whole-wheat, 1 slice / 1.9 g  Bread, white, 1 slice / 0.7 g  Bread, raisin, 1 slice / 1.2 g  Bagel, plain, 3 oz / 2 g  Tortilla, flour, 1 oz / 0.9 g  Tortilla, corn, 1 small / 1.5 g  Bun, hamburger or hotdog, 1 small / 0.9 g Fruits / Dietary Fiber (g)  Apple, raw with skin, 1 medium / 4.4 g  Applesauce, sweetened,  cup / 1.5 g  Banana,  medium / 1.5 g  Grapes, 10 grapes / 0.4 g  Orange, 1 small / 2.3 g  Raisin, 1.5 oz / 1.6 g  Melon, 1 cup / 1.4 g Vegetables / Dietary Fiber (g)  Green beans, canned,  cup / 1.3 g  Carrots (cooked),  cup / 2.3 g  Broccoli (cooked),  cup / 2.8 g  Peas, frozen (cooked),  cup / 4.4 g  Potatoes, mashed,  cup / 1.6 g  Lettuce, 1 cup / 0.5 g  Corn, canned,  cup / 1.6 g  Tomato,  cup / 1.1 g Document Released: 02/21/2007 Document Revised: 12/28/2011 Document Reviewed: 04/18/2007 ExitCare Patient Information 2014 [place], [place].

## 2014-01-24 NOTE — ED Notes (Signed)
Pt  Reports  abd  Pain this  Am   Started  About 0400  Am      -        Pt  denys  Any  Vomiting   denys  Any  Diarrhea  Had  bm  This  Am   Pt      -              PT  ACTUALLY REPORTS  THE  PAIN IS  BETTER NOW                HE  IS       SITTING  UPRIGHT ON  EXAM TABLE  SPEAKING IN  COMPLETE  SENTANCES

## 2014-01-24 NOTE — ED Provider Notes (Signed)
CSN: [id number]     Arrival date & time 01/24/14  1015 History   First MD Initiated Contact with Patient 01/24/14 1105     Chief Complaint  Patient presents with  . Abdominal Pain   (Consider location/radiation/quality/duration/timing/severity/associated sxs/prior Treatment) HPI Comments: Describes migratory "rumbling" discomfort in his abdomen. Has had the episodes for quite sometime and they often either resolve spontaneously or improve after bowel movement. Current episode began this morning at 4am and has already nearly resolved. Denies N/V/D/C, fever, BRBPR or melena. No change in symptoms with meal intake. No previous abdominal surgery. Denies GU issues. PCP: none  Patient is a 53 y.o. male presenting with abdominal pain. The history is provided by the patient.  Abdominal Pain Pain location:  Generalized (+migratory) Pain quality: aching   Pain quality comment:  "stomach rumbling" Pain radiates to:  Does not radiate Pain severity:  Moderate Onset quality:  Gradual Duration: current episode began at 4:00 am this morning. Progression:  Improving (nearly resolved) Chronicity:  Recurrent Relieved by:  Bowel activity Worsened by:  Nothing tried (no change with meal intake) Risk factors: no alcohol abuse, no aspirin use, has not had multiple surgeries, no NSAID use and not obese   Risk factors comment:  Hx of smoking   History reviewed. No pertinent past medical history. Past Surgical History  Procedure Laterality Date  . Knee arthroscopy     History reviewed. No pertinent family history. History  Substance Use Topics  . Smoking status: Current Every Day Smoker  . Smokeless tobacco: Not on file  . Alcohol Use: No    Review of Systems  Gastrointestinal: Positive for abdominal pain.  All other systems reviewed and are negative.   Allergies  Review of patient's allergies indicates no known allergies.  Home Medications   Current Outpatient Rx  Name  Route  Sig   Dispense  Refill  . ibuprofen (ADVIL,MOTRIN) 800 MG tablet   Oral   Take 800 mg by mouth every 8 (eight) hours as needed.           . polyethylene glycol (MIRALAX / GLYCOLAX) packet   Oral   Take 17 g by mouth daily. X 5 days   14 each   0    BP 133/88  Pulse 68  Temp(Src) 97.9 F (36.6 C) (Oral)  Resp 16  SpO2 100% Physical Exam  Nursing note and vitals reviewed. Constitutional: He is oriented to person, place, and time. He appears well-developed and well-nourished. No distress.  HENT:  Head: Normocephalic and atraumatic.  Eyes: Conjunctivae are normal. No scleral icterus.  Cardiovascular: Normal rate, regular rhythm and normal heart sounds.   Pulmonary/Chest: Effort normal and breath sounds normal. No respiratory distress. He has no wheezes.  Abdominal: Soft. Bowel sounds are normal. He exhibits no distension and no mass. There is no tenderness. There is no rebound and no guarding.  No pain at time of today's exam.   Musculoskeletal: Normal range of motion.  Neurological: He is alert and oriented to person, place, and time.  Skin: Skin is warm and dry.  Psychiatric: He has a normal mood and affect. His behavior is normal.    ED Course  Procedures (including critical care time) Labs Review Labs Reviewed - No data to display Imaging Review Dg Abd 2 Views  01/24/2014   CLINICAL DATA:  Lower abdominal pain with vomiting and diarrhea since this morning.  EXAM: ABDOMEN - 2 VIEW  COMPARISON:  DG ABDOMEN 1V dated 08/30/2011  FINDINGS: The bowel gas pattern is within the limits of normal. There is a moderate stool burden in the right colon. There is no evidence of small or large bowel obstruction or ileus. No abnormal soft tissue calcifications are present. The bony structures are normal in appearance. The lung bases are clear.  IMPRESSION: No acute intra-abdominal abnormality is demonstrated.   Electronically Signed   By: David  [place]   On: 01/24/2014 12:04     MDM   1.  Constipation   Moderate stool burden despite regular bowel movements. Stool burden likely responsible for intermittent migratory abdominal discomfort. Will advise 5 day course of Miralax and some dietary changes. Encouraged patient to locate PCP to establish for care.    [place], [place] 01/24/14 1230

## 2014-01-25 NOTE — ED Provider Notes (Signed)
Medical screening examination/treatment/procedure(s) were performed by non-physician practitioner and as supervising physician I was immediately available for consultation/collaboration.  Philipp Deputy, M.D.  Harden Mo, MD 01/25/14 [phone]

## 2014-08-20 ENCOUNTER — Encounter (HOSPITAL_COMMUNITY): Payer: Self-pay | Admitting: *Deleted

## 2014-08-20 ENCOUNTER — Emergency Department (HOSPITAL_COMMUNITY)
Admission: EM | Admit: 2014-08-20 | Discharge: 2014-08-20 | Disposition: A | Discharge status: Home / Self Care | Payer: 59 | Source: Home / Self Care | Attending: Emergency Medicine | Admitting: Emergency Medicine

## 2014-08-20 DIAGNOSIS — R197 Diarrhea, unspecified: Secondary | ICD-10-CM

## 2014-08-20 DIAGNOSIS — A09 Infectious gastroenteritis and colitis, unspecified: Secondary | ICD-10-CM

## 2014-08-20 DIAGNOSIS — R1084 Generalized abdominal pain: Secondary | ICD-10-CM

## 2014-08-20 LAB — POCT I-STAT, CHEM 8
BUN: 14 mg/dL (ref 6–23)
CALCIUM ION: 1.19 mmol/L (ref 1.12–1.23)
CHLORIDE: 107 meq/L (ref 96–112)
CREATININE: 0.9 mg/dL (ref 0.50–1.35)
GLUCOSE: 99 mg/dL (ref 70–99)
HCT: 49 % (ref 39.0–52.0)
Hemoglobin: 16.7 g/dL (ref 13.0–17.0)
Potassium: 4 mEq/L (ref 3.7–5.3)
Sodium: 140 mEq/L (ref 137–147)
TCO2: 24 mmol/L (ref 0–100)

## 2014-08-20 LAB — OCCULT BLOOD, POC DEVICE: FECAL OCCULT BLD: POSITIVE — AB

## 2014-08-20 MED ORDER — ONDANSETRON 8 MG PO TBDP: 8.0000 mg | ORAL_TABLET | Freq: Three times a day (TID) | ORAL | Status: DC | PRN

## 2014-08-20 MED ORDER — DICYCLOMINE HCL 20 MG PO TABS: 20.0000 mg | ORAL_TABLET | Freq: Four times a day (QID) | ORAL | Status: DC | PRN

## 2014-08-20 NOTE — Discharge Instructions (Signed)
Bloody Diarrhea °Bloody diarrhea can be caused by many different conditions. Most of the time bloody diarrhea is the result of food poisoning or minor infections. Bloody diarrhea usually improves over 2 to 3 days of rest and fluid replacement. Other conditions that can cause bloody diarrhea include: °· Internal bleeding. °· Infection. °· Diseases of the bowel and colon. °Internal bleeding from an ulcer or bowel disease can be severe and requires hospital care or even surgery. °DIAGNOSIS  °To find out what is wrong your caregiver may check your: °· Stool. °· Blood. °· Results from a test that looks inside the body (endoscopy). °TREATMENT  °· Get plenty of rest. °· Drink enough water and fluids to keep your urine clear or pale yellow. °· Do not smoke. °· Solid foods and dairy products should be avoided until your illness improves. °· As you improve, slowly return to a regular diet with easily-digested foods first. Examples are: °· Bananas. °· Rice. °· Toast. °· Crackers. °You should only need these for about 2 days before adding more normal foods to your diet. °· Avoid spicy or fatty foods as well as caffeine and alcohol for several days. °· Medicine to control cramping and diarrhea can relieve symptoms but may prolong some cases of bloody diarrhea. Antibiotics can speed recovery from diarrhea due to some bacterial infections. Call your caregiver if diarrhea does not get better in 3 days. °SEEK MEDICAL CARE IF:  °· You do not improve after 3 days. °· Your diarrhea improves but your stool appears black. °SEEK IMMEDIATE MEDICAL CARE IF:  °· You become extremely weak or faint. °· You become very sweaty. °· You have increased pain or bleeding. °· You develop repeated vomiting. °· You vomit and you see blood or the vomit looks black in color. °· You have a fever. °Document Released: 10/05/2005 Document Revised: 12/28/2011 Document Reviewed: 09/06/2009 °ExitCare® Patient Information ©2015 [employer]. This information is  not intended to replace advice given to you by your health care provider. Make sure you discuss any questions you have with your health care provider. ° °Abdominal Pain °Many things can cause abdominal pain. Usually, abdominal pain is not caused by a disease and will improve without treatment. It can often be observed and treated at home. Your health care provider will do a physical exam and possibly order blood tests and X-rays to help determine the seriousness of your pain. However, in many cases, more time must pass before a clear cause of the pain can be found. Before that point, your health care provider may not know if you need more testing or further treatment. °HOME CARE INSTRUCTIONS  °Monitor your abdominal pain for any changes. The following actions may help to alleviate any discomfort you are experiencing: °· Only take over-the-counter or prescription medicines as directed by your health care provider. °· Do not take laxatives unless directed to do so by your health care provider. °· Try a clear liquid diet (broth, tea, or water) as directed by your health care provider. Slowly move to a bland diet as tolerated. °SEEK MEDICAL CARE IF: °· You have unexplained abdominal pain. °· You have abdominal pain associated with nausea or diarrhea. °· You have pain when you urinate or have a bowel movement. °· You experience abdominal pain that wakes you in the night. °· You have abdominal pain that is worsened or improved by eating food. °· You have abdominal pain that is worsened with eating fatty foods. °· You have a fever. °SEEK   IMMEDIATE MEDICAL CARE IF:  °· Your pain does not go away within 2 hours. °· You keep throwing up (vomiting). °· Your pain is felt only in portions of the abdomen, such as the right side or the left lower portion of the abdomen. °· You pass bloody or black tarry stools. °MAKE SURE YOU: °· Understand these instructions.   °· Will watch your condition.   °· Will get help right away if you  are not doing well or get worse.   °Document Released: 07/15/2005 Document Revised: 10/10/2013 Document Reviewed: 06/14/2013 °ExitCare® Patient Information ©2015 [employer]. This information is not intended to replace advice given to you by your health care provider. Make sure you discuss any questions you have with your health care provider. ° °

## 2014-08-20 NOTE — ED Provider Notes (Signed)
CSN: [id number]     Arrival date & time 08/20/14  0911 History   First MD Initiated Contact with Patient 08/20/14 [phone]     Chief Complaint  Patient presents with  . Abdominal Pain    (Consider location/radiation/quality/duration/timing/severity/associated sxs/prior Treatment) HPI        53 year old male presents for evaluation of abdominal pain and bloody diarrhea. This initially began a few months ago. He has been seen here for this in the past and had temporary relief from [facility] lax. Until this morning he never had any blood, but  today he passed bright red blood per rectum. He denies weight loss, night sweats, fever, chills, NVD. His abdominal pain happens frequently and is relieved by bowel movements. He is 61, he has never had a colonoscopy. No family history of colon cancer.  History reviewed. No pertinent past medical history. Past Surgical History  Procedure Laterality Date  . Knee arthroscopy     No family history on file. History  Substance Use Topics  . Smoking status: Current Every Day Smoker  . Smokeless tobacco: Not on file  . Alcohol Use: No    Review of Systems  Gastrointestinal: Positive for nausea, abdominal pain, diarrhea, constipation and blood in stool. Negative for vomiting.  All other systems reviewed and are negative.   Allergies  Review of patient's allergies indicates no known allergies.  Home Medications   Prior to Admission medications   Medication Sig Start Date End Date Taking? Authorizing Provider  dicyclomine (BENTYL) 20 MG tablet Take 1 tablet (20 mg total) by mouth every 6 (six) hours as needed for spasms. 08/20/14   Freeman Caldron Peregrine Nolt, PA-C  ibuprofen (ADVIL,MOTRIN) 800 MG tablet Take 800 mg by mouth every 8 (eight) hours as needed.      Historical Provider, MD  ondansetron (ZOFRAN ODT) 8 MG disintegrating tablet Take 1 tablet (8 mg total) by mouth every 8 (eight) hours as needed for nausea or vomiting. 08/20/14   Freeman Caldron Ott Zimmerle, PA-C  polyethylene  glycol (MIRALAX / GLYCOLAX) packet Take 17 g by mouth daily. X 5 days 01/24/14   Annett Gula H Presson, PA   BP 129/84 mmHg  Temp(Src) 98.4 F (36.9 C) (Oral)  Resp 16  SpO2 97% Physical Exam  Constitutional: He is oriented to person, place, and time. He appears well-developed and well-nourished. No distress.  HENT:  Head: Normocephalic.  Cardiovascular: Normal rate, regular rhythm and normal heart sounds.   Pulmonary/Chest: Effort normal and breath sounds normal. No respiratory distress.  Abdominal: Soft. Bowel sounds are normal. He exhibits no distension and no mass. There is no tenderness. There is no rebound and no guarding.  Genitourinary: Rectal exam shows external hemorrhoid and mass (possible large, firm, nontender rectal mass versus enlarged prostate). Rectal exam shows no internal hemorrhoid, no fissure, no tenderness and anal tone normal. Guaiac positive stool.  Neurological: He is alert and oriented to person, place, and time. Coordination normal.  Skin: Skin is warm and dry. No rash noted. He is not diaphoretic.  Psychiatric: He has a normal mood and affect. Judgment normal.  Nursing note and vitals reviewed.   ED Course  Procedures (including critical care time) Labs Review Labs Reviewed  OCCULT BLOOD, POC DEVICE - Abnormal; Notable for the following:    Fecal Occult Bld POSITIVE (*)    All other components within normal limits  POCT I-STAT, CHEM 8    Imaging Review No results found.   MDM   1. Generalized abdominal  pain   2. Bloody diarrhea    I will call gastroenterology and made this appointment. Treat symptomatically until that time.ED if worsening  Possible rectal mass, needs colonoscopy.     Meds ordered this encounter  Medications  . dicyclomine (BENTYL) 20 MG tablet    Sig: Take 1 tablet (20 mg total) by mouth every 6 (six) hours as needed for spasms.    Dispense:  20 tablet    Refill:  0    Order Specific Question:  Supervising Provider     Answer:  Jake Michaelis, DAVID C [id number]  . ondansetron (ZOFRAN ODT) 8 MG disintegrating tablet    Sig: Take 1 tablet (8 mg total) by mouth every 8 (eight) hours as needed for nausea or vomiting.    Dispense:  12 tablet    Refill:  0    Order Specific Question:  Supervising Provider    Answer:  Jake Michaelis, DAVID C [[id number]]      Liam Graham, PA-C 08/20/14 1050

## 2014-08-20 NOTE — ED Notes (Signed)
Pt  Reports       abd  Pain  For  About  1  Week         With  Diarrhea  At  Intervals  Nausea no recent  Vomiting      Pt   States  sev  Days  Ago he  Woke  Up with  A  Sweat        At  This time pt  Is  Sitting  Upright  On  The  Exam table speaking  In  Complete  sentances     And  Is  In no acute  Distress

## 2014-08-21 ENCOUNTER — Encounter: Payer: Self-pay | Admitting: Physician Assistant

## 2014-08-28 ENCOUNTER — Telehealth: Payer: Self-pay | Admitting: *Deleted

## 2014-08-28 ENCOUNTER — Encounter: Payer: Self-pay | Admitting: Physician Assistant

## 2014-08-28 ENCOUNTER — Ambulatory Visit (INDEPENDENT_AMBULATORY_CARE_PROVIDER_SITE_OTHER): Payer: 59 | Admitting: Physician Assistant

## 2014-08-28 VITALS — BP 130/80 | HR 84 | Ht 71.0 in | Wt 214.4 lb

## 2014-08-28 DIAGNOSIS — R194 Change in bowel habit: Secondary | ICD-10-CM

## 2014-08-28 DIAGNOSIS — K625 Hemorrhage of anus and rectum: Secondary | ICD-10-CM | POA: Insufficient documentation

## 2014-08-28 DIAGNOSIS — R1032 Left lower quadrant pain: Secondary | ICD-10-CM

## 2014-08-28 DIAGNOSIS — R195 Other fecal abnormalities: Secondary | ICD-10-CM | POA: Insufficient documentation

## 2014-08-28 MED ORDER — MOVIPREP 100 G PO SOLR
1.0000 | Freq: Once | ORAL | Status: DC
Start: 2014-08-28 — End: 2014-09-06

## 2014-08-28 NOTE — Progress Notes (Signed)
i agree with the above note, plan 

## 2014-08-28 NOTE — Telephone Encounter (Signed)
LMOM for patient to call back to get lab work

## 2014-08-28 NOTE — Patient Instructions (Signed)
You have been scheduled for a colonoscopy with Dr. Ardis Hughs at [facility]. Please follow written instructions given to you at your visit today.  Please pick up your prep kit at the pharmacy within the next 1-3 days. If you use inhalers (even only as needed), please bring them with you on the day of your procedure. Your physician has requested that you go to www.startemmi.com and enter the access code given to you at your visit today. This web site gives a general overview about your procedure. However, you should still follow specific instructions given to you by our office regarding your preparation for the procedure.  Continue your Dicyclomine(Bentyl)

## 2014-08-28 NOTE — Telephone Encounter (Signed)
-----   Message from Vita Barley Hvozdovic, PA-C sent at 08/28/2014 12:37 PM EST ----- Can you have pt go for a CBC?

## 2014-08-28 NOTE — Progress Notes (Signed)
Wyatt ID: Darrell Wyatt, male   DOB: 07-16-1961, 53 y.o.   MRN: [id number]    HPI:Darrell Wyatt is a 53 year old gentleman referred for evaluation by Allena Katz, PA-C, due to rectal bleeding ,abdominal pain, and a change in bowel habits.  Darrell Wyatt says that for Darrell past several months he has had left lower quadrant pain and rectal bleeding. He says when he eats he gets crampy abdominal pain and has to have a bowel movement. His stools used to be formed and occur once a day, for Darrell past several months his stools have been soft and pencil thin and move more frequently than normal. His abdominal pain is somewhat relieved with defacation. He has no rectal pain. His appetite has been good and his weight has been stable. Last week he had rather severe abdominal pain with bloody bowel movements and nausea. He was seen in an urgent care center and given dicyclomine. He was then told to follow up with GI. He denies a family history of colon cancer, colon polyps, or inflammatory bowel disease.He denies complaints of nocturia, or urinary hesitancy.   History reviewed. No pertinent past medical history.  Past Surgical History  Procedure Laterality Date  . Knee arthroscopy     History reviewed. No pertinent family history. History  Substance Use Topics  . Smoking status: Current Every Day Smoker    Types: Cigarettes  . Smokeless tobacco: Not on file  . Alcohol Use: No   Current Outpatient Prescriptions  Medication Sig Dispense Refill  . dicyclomine (BENTYL) 20 MG tablet Take 1 tablet (20 mg total) by mouth every 6 (six) hours as needed for spasms. 20 tablet 0  . ibuprofen (ADVIL,MOTRIN) 800 MG tablet Take 800 mg by mouth every 8 (eight) hours as needed.      Marland Kitchen MOVIPREP 100 G SOLR Take 1 kit (200 g total) by mouth once. 1 kit 0  . ondansetron (ZOFRAN ODT) 8 MG disintegrating tablet Take 1 tablet (8 mg total) by mouth every 8 (eight) hours as needed for nausea or vomiting. 12 tablet 0  .  polyethylene glycol (MIRALAX / GLYCOLAX) packet Take 17 g by mouth daily. X 5 days 14 each 0   No current facility-administered medications for this visit.   No Known Allergies   Review of Systems: Gen: Denies any fever, chills, sweats, anorexia, fatigue, weakness, malaise, weight loss, and sleep disorder CV: Denies chest pain, angina, palpitations, syncope, orthopnea, PND, peripheral edema, and claudication. Resp: Denies dyspnea at rest, dyspnea with exercise, cough, sputum, wheezing, coughing up blood, and pleurisy. GI: Denies vomiting blood, jaundice, and fecal incontinence.   Denies dysphagia or odynophagia. GU : Denies urinary burning, blood in urine, urinary frequency, urinary hesitancy, nocturnal urination, and urinary incontinence. MS: Denies joint pain, limitation of movement, and swelling, stiffness, low back pain, extremity pain. Denies muscle weakness, cramps, atrophy.  Derm: Denies rash, itching, dry skin, hives, moles, warts, or unhealing ulcers.  Psych: Denies depression, anxiety, memory loss, suicidal ideation, hallucinations, paranoia, and confusion. Heme: Denies bruising, bleeding, and enlarged lymph nodes. Neuro:  Denies any headaches, dizziness, paresthesias. Endo:  Denies any problems with DM, thyroid, adrenal function  Studies: Abdominal x-ray on 08/30/2011 showed a normal bowel gas pattern and a moderate stool burden in Darrell right colon.  LAB RESULTS: Stool for occult blood on 08/20/2014 was positive   Physical Exam: BP 130/80 mmHg  Pulse 84  Ht $R'5\' 11"'nR$  (1.803 m)  Wt 214 lb 6.4 oz (97.251 kg)  BMI 29.92  kg/m2  SpO2 96% Constitutional: Pleasant,well-developed, male in no acute distress. HEENT: Normocephalic and atraumatic. Conjunctivae are normal. No scleral icterus. Neck supple. No thyromegaly Cardiovascular: Normal rate, regular rhythm.  Pulmonary/chest: Effort normal and breath sounds normal. No wheezing, rales or rhonchi. Abdominal: Soft, nondistended,  mild LLQ and suprapubic tenderness, no rebound or guarding, no CVAT,  Bowel sounds active throughout. There are no masses palpable. No hepatomegaly. Rectal:brwon stool, heme positive, ? enlarged prostate vs rectal polyp/mass Extremities: no edema Lymphadenopathy: No cervical adenopathy noted. Neurological: Alert and oriented to person place and time. Skin: Skin is warm and dry. No rashes noted. Psychiatric: Normal mood and affect. Behavior is normal.  ASSESSMENT AND PLAN: 53 year old African-American male with a several month history of intermittent lower abdominal pain, rectal bleeding, any change in bowel habits referred for evaluation. He will be scheduled for colonoscopy to evaluate for polyps, neoplasia, or inflammatory process. Darrell risks, benefits, and alternatives to colonoscopy with possible biopsy and possible polypectomy were discussed with Darrell Wyatt and they consent to proceed. Darrell procedure will be scheduled with Dr. Ardis Hughs. Further recommendations will be made pending Darrell findings of his colonoscopy. A CBC will be obtained. Darrell Wyatt will continue his dicyclomine as needed.    Celese Banner, Vita Barley PA-C 08/28/2014, 12:27 PM

## 2014-09-06 ENCOUNTER — Encounter (HOSPITAL_COMMUNITY): Payer: Self-pay | Admitting: Gastroenterology

## 2014-09-06 ENCOUNTER — Encounter (HOSPITAL_COMMUNITY)
Admission: RE | Disposition: A | Discharge status: Home / Self Care | Payer: 59 | Source: Ambulatory Visit | Attending: Gastroenterology

## 2014-09-06 ENCOUNTER — Ambulatory Visit (HOSPITAL_COMMUNITY)
Admission: RE | Admit: 2014-09-06 | Discharge: 2014-09-06 | Disposition: A | Discharge status: Home / Self Care | Payer: 59 | Source: Ambulatory Visit | Attending: Gastroenterology | Admitting: Gastroenterology

## 2014-09-06 DIAGNOSIS — K5730 Diverticulosis of large intestine without perforation or abscess without bleeding: Secondary | ICD-10-CM | POA: Insufficient documentation

## 2014-09-06 DIAGNOSIS — R194 Change in bowel habit: Secondary | ICD-10-CM | POA: Diagnosis not present

## 2014-09-06 DIAGNOSIS — K625 Hemorrhage of anus and rectum: Secondary | ICD-10-CM | POA: Diagnosis not present

## 2014-09-06 DIAGNOSIS — F17210 Nicotine dependence, cigarettes, uncomplicated: Secondary | ICD-10-CM | POA: Insufficient documentation

## 2014-09-06 DIAGNOSIS — R195 Other fecal abnormalities: Secondary | ICD-10-CM | POA: Diagnosis not present

## 2014-09-06 DIAGNOSIS — R1032 Left lower quadrant pain: Secondary | ICD-10-CM | POA: Diagnosis not present

## 2014-09-06 DIAGNOSIS — Z1211 Encounter for screening for malignant neoplasm of colon: Secondary | ICD-10-CM | POA: Insufficient documentation

## 2014-09-06 HISTORY — PX: COLONOSCOPY: SHX5424

## 2014-09-06 SURGERY — COLONOSCOPY
Anesthesia: Moderate Sedation

## 2014-09-06 MED ORDER — SODIUM CHLORIDE 0.9 % IV SOLN
INTRAVENOUS | Status: DC
  Administered 2014-09-06: 1000 mL via INTRAVENOUS

## 2014-09-06 MED ORDER — FENTANYL CITRATE 0.05 MG/ML IJ SOLN
INTRAMUSCULAR | Status: AC
  Filled 2014-09-06: qty 4

## 2014-09-06 MED ORDER — LACTATED RINGERS IV SOLN: INTRAVENOUS | Status: DC

## 2014-09-06 MED ORDER — MIDAZOLAM HCL 10 MG/2ML IJ SOLN
INTRAMUSCULAR | Status: AC
  Filled 2014-09-06: qty 2

## 2014-09-06 MED ORDER — FENTANYL CITRATE 0.05 MG/ML IJ SOLN
INTRAMUSCULAR | Status: DC | PRN
  Administered 2014-09-06 (×4): 25 ug via INTRAVENOUS

## 2014-09-06 MED ORDER — MIDAZOLAM HCL 5 MG/5ML IJ SOLN
INTRAMUSCULAR | Status: DC | PRN
  Administered 2014-09-06 (×5): 2 mg via INTRAVENOUS

## 2014-09-06 NOTE — Interval H&P Note (Signed)
History and Physical Interval Note:  09/06/2014 1:46 PM  Darrell Wyatt  has presented today for surgery, with the diagnosis of left lower quad pain, heme positive stool, rectal bleeding and change in bowels  The various methods of treatment have been discussed with the patient and family. After consideration of risks, benefits and other options for treatment, the patient has consented to  Procedure(s): COLONOSCOPY (N/A) as a surgical intervention .  The patient's history has been reviewed, patient examined, no change in status, stable for surgery.  I have reviewed the patient's chart and labs.  Questions were answered to the patient's satisfaction.     [name]

## 2014-09-06 NOTE — Op Note (Signed)
[facility] [place], [address]   COLONOSCOPY PROCEDURE REPORT  PATIENT: Darrell Wyatt, Darrell Wyatt  MR#: [id number] BIRTHDATE: 1960-11-12 , 53  yrs. old GENDER: male ENDOSCOPIST: Efraim Kaufmann PROCEDURE DATE:  09/06/2014 PROCEDURE:   Colonoscopy, diagnostic First Screening Colonoscopy - Avg.  risk and is 50 yrs.  old or older - No.  Prior Negative Screening - Now for repeat screening. N/A  History of Adenoma - Now for follow-up colonoscopy & has been > or = to 3 yrs.  N/A  Polyps Removed Today? No.  Recommend repeat exam, <10 yrs? No. ASA CLASS:   Class II INDICATIONS:change in bowel habits, intermittent abd pains. MEDICATIONS: Fentanyl 100 mcg IV and Versed 10 mg IV  DESCRIPTION OF PROCEDURE:   After the risks benefits and alternatives of the procedure were thoroughly explained, informed consent was obtained.  The digital rectal exam revealed no abnormalities of the rectum.   The EC-[id number] ([id number])  endoscope was introduced through the anus and advanced to the cecum, which was identified by both the appendix and ileocecal valve. No adverse events experienced.   The quality of the prep was excellent.  The instrument was then slowly withdrawn as the colon was fully examined.   COLON FINDINGS: There was moderate diverticular changes in left colon, associated with mucosal thickening, tortuosity of lumen. The examination was otherwise normal.  Retroflexed views revealed no abnormalities. The time to cecum=5 minutes 00 seconds. Withdrawal time=7 minutes 00 seconds.  The scope was withdrawn and the procedure completed. COMPLICATIONS: There were no immediate complications.  ENDOSCOPIC IMPRESSION: There was moderate diverticular changes in left colon, associated with mucosal thickening, tortuosity of lumen.  The examination was otherwise normal  RECOMMENDATIONS: Please start once daily OTC fiber supplement (such as orange flavored, powder citrucel).   Please call to report on your response in 3-4 weeks. You should have repeat colon cancer screening with colonoscopy in 10 years.  eSigned:  [name], MD 09/06/2014 2:46 PM

## 2014-09-06 NOTE — Discharge Instructions (Signed)

## 2014-09-06 NOTE — H&P (View-Only) (Signed)
Patient ID: Darrell Wyatt, male   DOB: 09/29/1961, 53 y.o.   MRN: [id number]    HPI:Darrell Wyatt is a 53 year old gentleman referred for evaluation by Allena Katz, PA-C, due to rectal bleeding ,abdominal pain, and a change in bowel habits.  Darrell Wyatt says that for the past several months he has had left lower quadrant pain and rectal bleeding. He says when he eats he gets crampy abdominal pain and has to have a bowel movement. His stools used to be formed and occur once a day, for the past several months his stools have been soft and pencil thin and move more frequently than normal. His abdominal pain is somewhat relieved with defacation. He has no rectal pain. His appetite has been good and his weight has been stable. Last week he had rather severe abdominal pain with bloody bowel movements and nausea. He was seen in an urgent care center and given dicyclomine. He was then told to follow up with GI. He denies a family history of colon cancer, colon polyps, or inflammatory bowel disease.He denies complaints of nocturia, or urinary hesitancy.   History reviewed. No pertinent past medical history.  Past Surgical History  Procedure Laterality Date  . Knee arthroscopy     History reviewed. No pertinent family history. History  Substance Use Topics  . Smoking status: Current Every Day Smoker    Types: Cigarettes  . Smokeless tobacco: Not on file  . Alcohol Use: No   Current Outpatient Prescriptions  Medication Sig Dispense Refill  . dicyclomine (BENTYL) 20 MG tablet Take 1 tablet (20 mg total) by mouth every 6 (six) hours as needed for spasms. 20 tablet 0  . ibuprofen (ADVIL,MOTRIN) 800 MG tablet Take 800 mg by mouth every 8 (eight) hours as needed.      Marland Kitchen MOVIPREP 100 G SOLR Take 1 kit (200 g total) by mouth once. 1 kit 0  . ondansetron (ZOFRAN ODT) 8 MG disintegrating tablet Take 1 tablet (8 mg total) by mouth every 8 (eight) hours as needed for nausea or vomiting. 12 tablet 0  .  polyethylene glycol (MIRALAX / GLYCOLAX) packet Take 17 g by mouth daily. X 5 days 14 each 0   No current facility-administered medications for this visit.   No Known Allergies   Review of Systems: Gen: Denies any fever, chills, sweats, anorexia, fatigue, weakness, malaise, weight loss, and sleep disorder CV: Denies chest pain, angina, palpitations, syncope, orthopnea, PND, peripheral edema, and claudication. Resp: Denies dyspnea at rest, dyspnea with exercise, cough, sputum, wheezing, coughing up blood, and pleurisy. GI: Denies vomiting blood, jaundice, and fecal incontinence.   Denies dysphagia or odynophagia. GU : Denies urinary burning, blood in urine, urinary frequency, urinary hesitancy, nocturnal urination, and urinary incontinence. MS: Denies joint pain, limitation of movement, and swelling, stiffness, low back pain, extremity pain. Denies muscle weakness, cramps, atrophy.  Derm: Denies rash, itching, dry skin, hives, moles, warts, or unhealing ulcers.  Psych: Denies depression, anxiety, memory loss, suicidal ideation, hallucinations, paranoia, and confusion. Heme: Denies bruising, bleeding, and enlarged lymph nodes. Neuro:  Denies any headaches, dizziness, paresthesias. Endo:  Denies any problems with DM, thyroid, adrenal function  Studies: Abdominal x-ray on 08/30/2011 showed a normal bowel gas pattern and a moderate stool burden in the right colon.  LAB RESULTS: Stool for occult blood on 08/20/2014 was positive   Physical Exam: BP 130/80 mmHg  Pulse 84  Ht $R'5\' 11"'qb$  (1.803 m)  Wt 214 lb 6.4 oz (97.251 kg)  BMI 29.92  kg/m2  SpO2 96% Constitutional: Pleasant,well-developed, male in no acute distress. HEENT: Normocephalic and atraumatic. Conjunctivae are normal. No scleral icterus. Neck supple. No thyromegaly Cardiovascular: Normal rate, regular rhythm.  Pulmonary/chest: Effort normal and breath sounds normal. No wheezing, rales or rhonchi. Abdominal: Soft, nondistended,  mild LLQ and suprapubic tenderness, no rebound or guarding, no CVAT,  Bowel sounds active throughout. There are no masses palpable. No hepatomegaly. Rectal:brwon stool, heme positive, ? enlarged prostate vs rectal polyp/mass Extremities: no edema Lymphadenopathy: No cervical adenopathy noted. Neurological: Alert and oriented to person place and time. Skin: Skin is warm and dry. No rashes noted. Psychiatric: Normal mood and affect. Behavior is normal.  ASSESSMENT AND PLAN: 53 year old African-American male with a several month history of intermittent lower abdominal pain, rectal bleeding, any change in bowel habits referred for evaluation. He will be scheduled for colonoscopy to evaluate for polyps, neoplasia, or inflammatory process. The risks, benefits, and alternatives to colonoscopy with possible biopsy and possible polypectomy were discussed with the patient and they consent to proceed. The procedure will be scheduled with Dr. Ardis Hughs. Further recommendations will be made pending the findings of his colonoscopy. A CBC will be obtained. The patient will continue his dicyclomine as needed.    Darrell Wyatt, Vita Barley PA-C 08/28/2014, 12:27 PM

## 2014-09-07 ENCOUNTER — Encounter (HOSPITAL_COMMUNITY): Payer: Self-pay | Admitting: Gastroenterology

## 2015-03-03 ENCOUNTER — Encounter (HOSPITAL_COMMUNITY): Payer: Self-pay | Admitting: *Deleted

## 2015-03-03 ENCOUNTER — Emergency Department (INDEPENDENT_AMBULATORY_CARE_PROVIDER_SITE_OTHER)
Admission: EM | Admit: 2015-03-03 | Discharge: 2015-03-03 | Disposition: A | Discharge status: Home / Self Care | Payer: Self-pay | Source: Home / Self Care | Attending: Family Medicine | Admitting: Family Medicine

## 2015-03-03 MED ORDER — METAXALONE 800 MG PO TABS: 800.0000 mg | ORAL_TABLET | Freq: Three times a day (TID) | ORAL | Status: DC

## 2015-03-03 MED ORDER — IBUPROFEN 800 MG PO TABS: 800.0000 mg | ORAL_TABLET | Freq: Three times a day (TID) | ORAL | Status: DC
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
## 2015-03-03 NOTE — Discharge Instructions (Signed)
Use heat and medicine as needed for soreness.

## 2017-04-22 ENCOUNTER — Ambulatory Visit: Payer: 59

## 2017-04-22 ENCOUNTER — Encounter: Payer: Self-pay | Admitting: Podiatry

## 2017-04-22 ENCOUNTER — Ambulatory Visit (INDEPENDENT_AMBULATORY_CARE_PROVIDER_SITE_OTHER): Payer: 59 | Admitting: Podiatry

## 2017-04-22 ENCOUNTER — Ambulatory Visit (INDEPENDENT_AMBULATORY_CARE_PROVIDER_SITE_OTHER): Payer: 59

## 2017-04-22 DIAGNOSIS — M722 Plantar fascial fibromatosis: Secondary | ICD-10-CM

## 2017-04-22 DIAGNOSIS — M216X9 Other acquired deformities of unspecified foot: Secondary | ICD-10-CM | POA: Diagnosis not present

## 2017-04-22 MED ORDER — DICLOFENAC SODIUM 75 MG PO TBEC: 75.0000 mg | DELAYED_RELEASE_TABLET | Freq: Two times a day (BID) | ORAL | 2 refills | Status: DC

## 2017-04-22 MED ORDER — TRIAMCINOLONE ACETONIDE 10 MG/ML IJ SUSP
10.0000 mg | Freq: Once | INTRAMUSCULAR | Status: AC
  Administered 2017-04-22: 10 mg

## 2017-04-22 NOTE — Progress Notes (Signed)
° °  Subjective:    Patient ID: Darrell Wyatt, male    DOB: Oct 30, 1960, 56 y.o.   MRN: [id number]  Foot Pain    Chief Complaint  Patient presents with   Foot Pain    Rt heel pain   x65months     Review of Systems  All other systems reviewed and are negative.      Objective:   Physical Exam        Assessment & Plan:

## 2017-04-22 NOTE — Patient Instructions (Signed)

## 2017-04-22 NOTE — Progress Notes (Signed)
Subjective:    Patient ID: Darrell Wyatt, male   DOB: 56 y.o.   MRN: [id number]   HPI patient states he's been developing a lot of pain in his right heel and he also has a lesion underneath his right foot states the heel pain is been present for approximately 4 months    Review of Systems  All other systems reviewed and are negative.       Objective:  Physical Exam  Cardiovascular: Intact distal pulses.   Musculoskeletal: Normal range of motion.  Neurological: He is alert.  Skin: Skin is warm.  Nursing note and vitals reviewed.  neurovascular status intact muscle strength adequate range of motion within normal limits with patient found to have exquisite discomfort plantar aspect right heel at the insertional point of the tendon calcaneus and has keratotic lesion sub-fourth metatarsal right that's painful when pressed. Noted to have good digital perfusion and well oriented 3     Assessment:  Acute plantar fasciitis right along with keratotic lesion consistent with probable plantarflexed metatarsal right     Plan:    H&P conditions reviewed and today injected the right plantar fascia 3 Milligan Kenalog 5 mill grams Xylocaine and applied fascial brace placed on diclofenac 75 mg twice a day and discussed possible treatment lesion depending on response. Reappoint to recheck  X-ray indicates small spur with no indications of stress fracture arthritis

## 2017-04-29 ENCOUNTER — Ambulatory Visit (INDEPENDENT_AMBULATORY_CARE_PROVIDER_SITE_OTHER): Payer: 59 | Admitting: Podiatry

## 2017-04-29 ENCOUNTER — Encounter: Payer: Self-pay | Admitting: Podiatry

## 2017-04-29 DIAGNOSIS — M722 Plantar fascial fibromatosis: Secondary | ICD-10-CM

## 2017-04-29 MED ORDER — TRIAMCINOLONE ACETONIDE 10 MG/ML IJ SUSP
10.0000 mg | Freq: Once | INTRAMUSCULAR | Status: AC
  Administered 2017-04-29: 10 mg

## 2017-04-29 NOTE — Progress Notes (Signed)
Subjective:    Patient ID: Darrell Wyatt, male   DOB: 57 y.o.   MRN: [id number]   HPI patient states he's significantly improved but still has moderate discomfort in the plantar aspect of the heel    ROS      Objective:  Physical Exam neurovascular status intact with discomfort plantar heel right that is still present but improved     Assessment:   Planter fasciitis right present but improved      Plan:    Reinjected the plantar fascia right 3 mg Kenalog 5 mill grams Xylocaine and will be seen back to recheck

## 2017-12-21 NOTE — Progress Notes (Unsigned)
Entered in error

## 2019-05-22 ENCOUNTER — Encounter (HOSPITAL_COMMUNITY): Payer: Self-pay

## 2019-05-22 ENCOUNTER — Other Ambulatory Visit: Payer: Self-pay

## 2019-05-22 ENCOUNTER — Emergency Department (HOSPITAL_COMMUNITY)
Admission: EM | Admit: 2019-05-22 | Discharge: 2019-05-22 | Disposition: A | Discharge status: Home / Self Care | Payer: 59 | Attending: Emergency Medicine | Admitting: Emergency Medicine

## 2019-05-22 DIAGNOSIS — J029 Acute pharyngitis, unspecified: Secondary | ICD-10-CM | POA: Diagnosis present

## 2019-05-22 DIAGNOSIS — Z5321 Procedure and treatment not carried out due to patient leaving prior to being seen by health care provider: Secondary | ICD-10-CM | POA: Diagnosis not present

## 2019-05-22 NOTE — ED Notes (Signed)
Pt stated he could not wait any longer and left

## 2019-05-22 NOTE — ED Triage Notes (Signed)
Pt endorses swelling and pain to left side of neck/throat with difficulty swallowing. Denies difficulty breathing. Pt went to ER in [place] earlier today and told to come to the ER if it got worse. Already started antibiotics and given antibiotic through IV there. Axox4. VSS.

## 2019-08-01 ENCOUNTER — Other Ambulatory Visit: Payer: Self-pay | Admitting: Internal Medicine

## 2019-08-01 ENCOUNTER — Ambulatory Visit
Admission: RE | Admit: 2019-08-01 | Discharge: 2019-08-01 | Disposition: A | Discharge status: Home / Self Care | Payer: 59 | Source: Ambulatory Visit | Attending: Internal Medicine | Admitting: Internal Medicine

## 2019-08-01 DIAGNOSIS — R319 Hematuria, unspecified: Secondary | ICD-10-CM

## 2019-08-01 IMAGING — CT CT ABD-PELV W/O CM
1 of 2 series · 14 of 32 positions shown, 19 images · non-contrast
Comparison: None.

CLINICAL DATA: Left lower quadrant/left flank pain, hematuria

EXAM:
CT ABDOMEN AND PELVIS WITHOUT CONTRAST
TECHNIQUE: Multidetector CT imaging of the abdomen and pelvis was performed
following the standard protocol without IV contrast.

[Series 2: abd/pelvis w/(date) · axial · 0.86mm/px · z∈[-412,-22]mm · 14 of 88 slices shown, 19 images]
[im 5/88  soft-tissue]
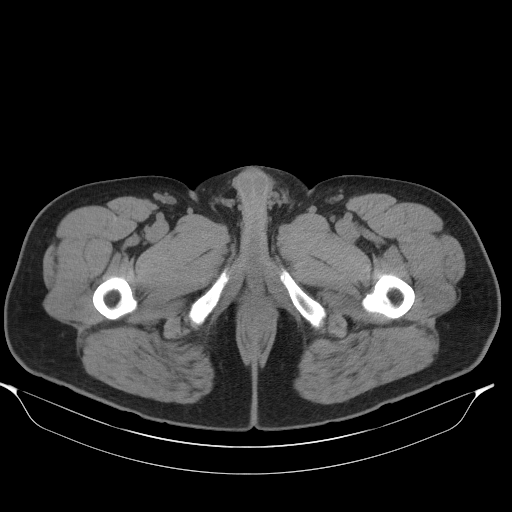
[im 5/88  bone]
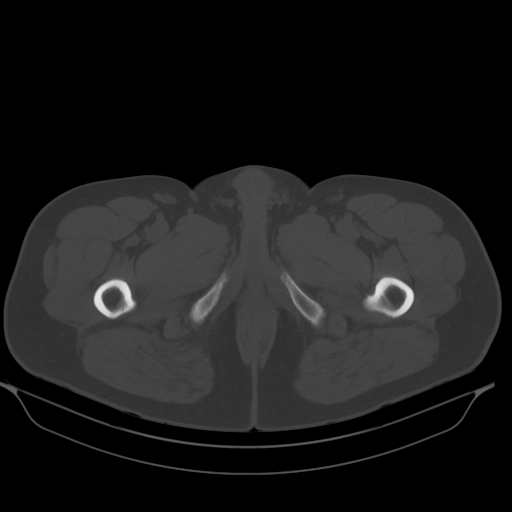
[im 14/88  soft-tissue]
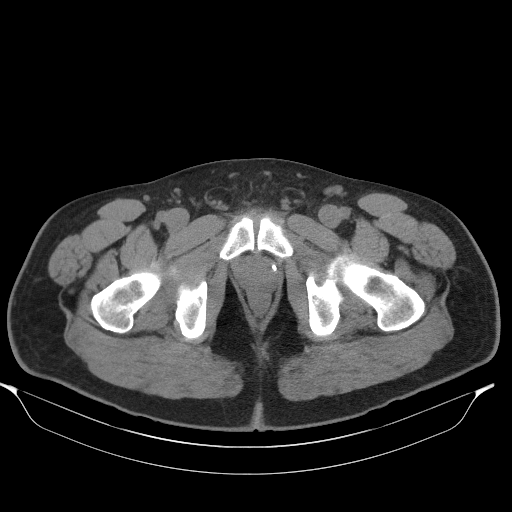
[im 18/88  soft-tissue]
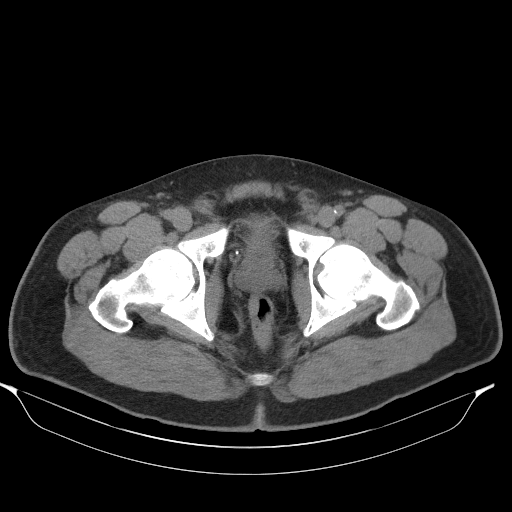
[im 27/88  soft-tissue]
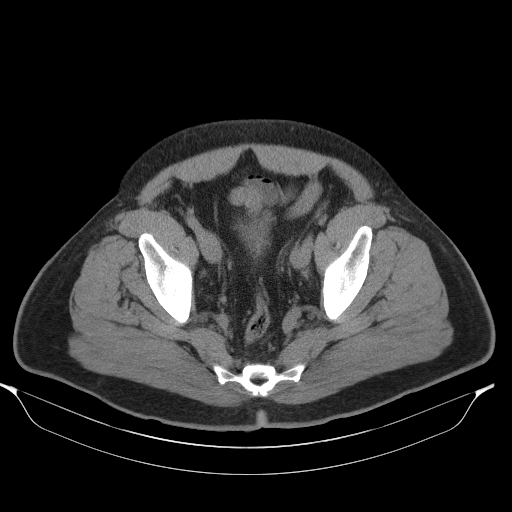
[im 31/88  soft-tissue]
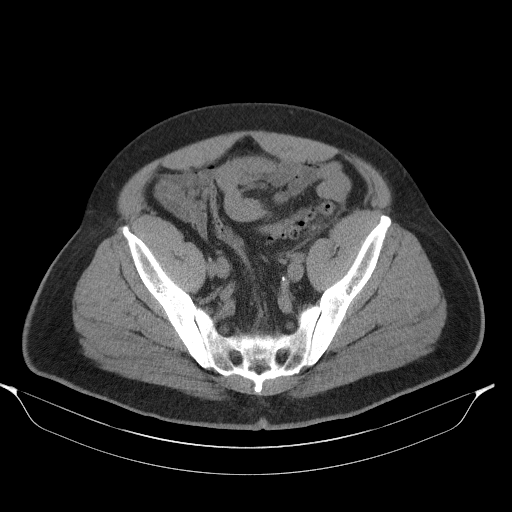
[im 40/88  soft-tissue]
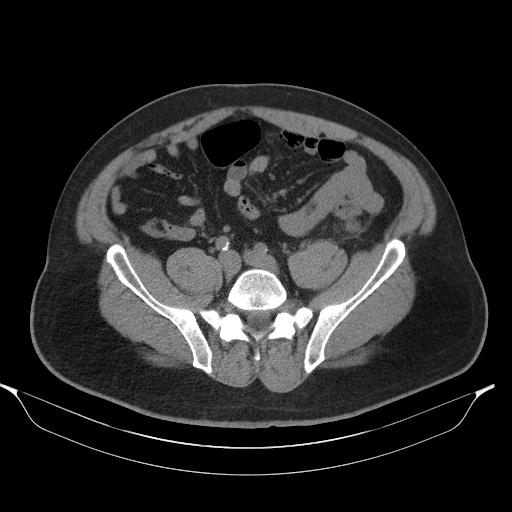
[im 44/88  soft-tissue]
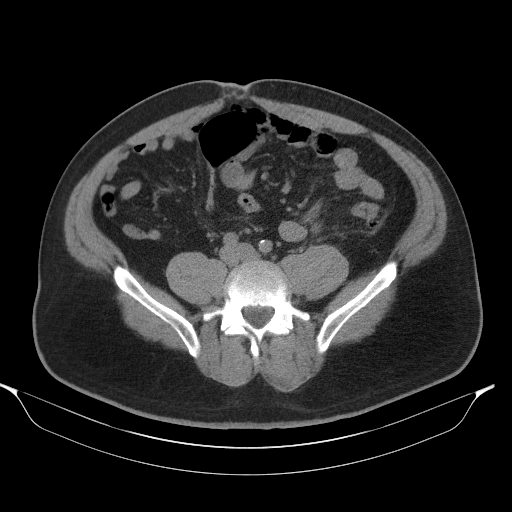
[im 48/88  soft-tissue]
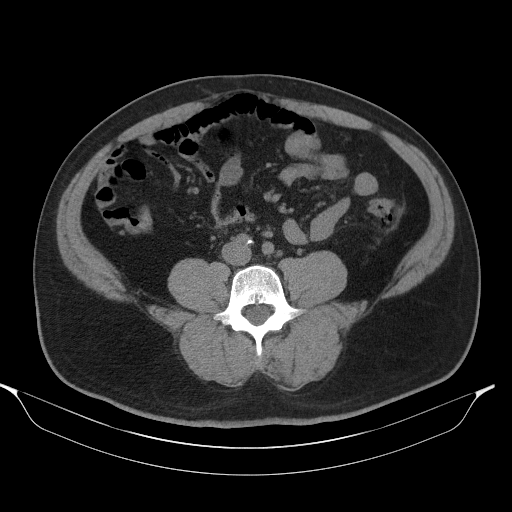
[im 57/88  soft-tissue]
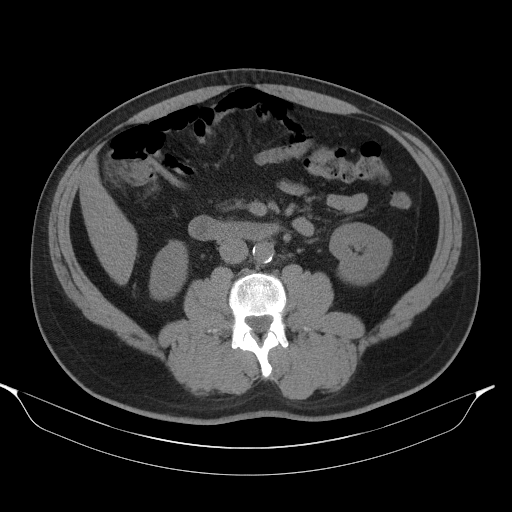
[im 57/88  bone]
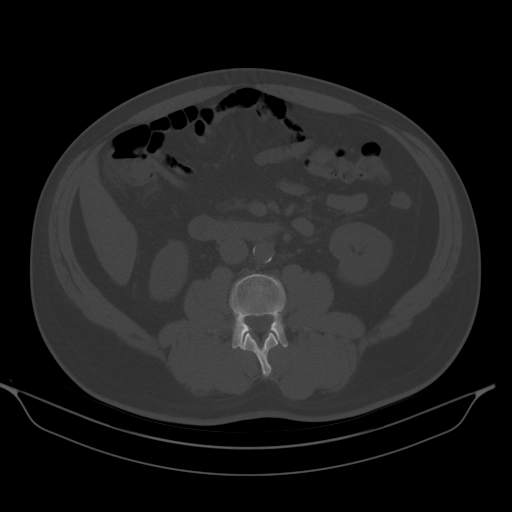
[im 61/88  soft-tissue]
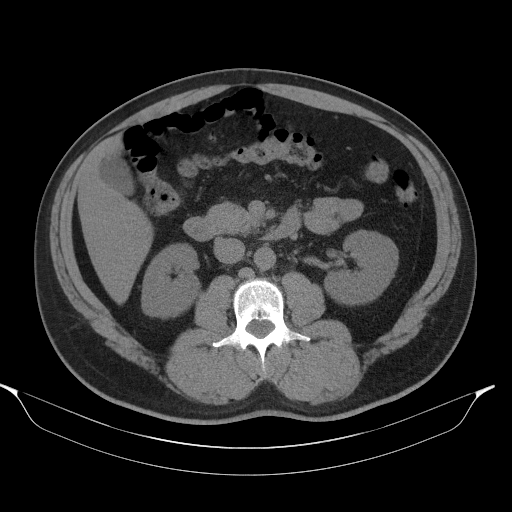
[im 70/88  soft-tissue]
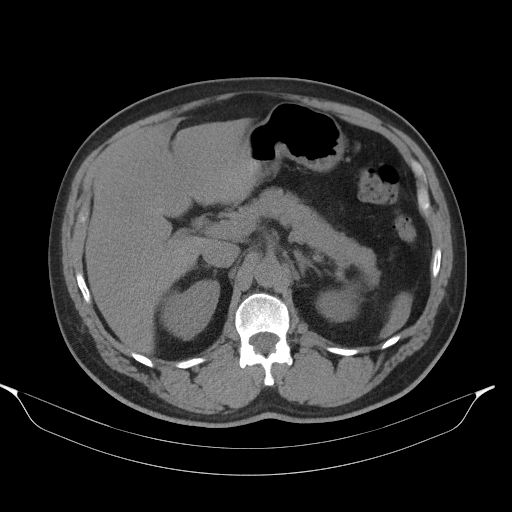
[im 70/88  lung]
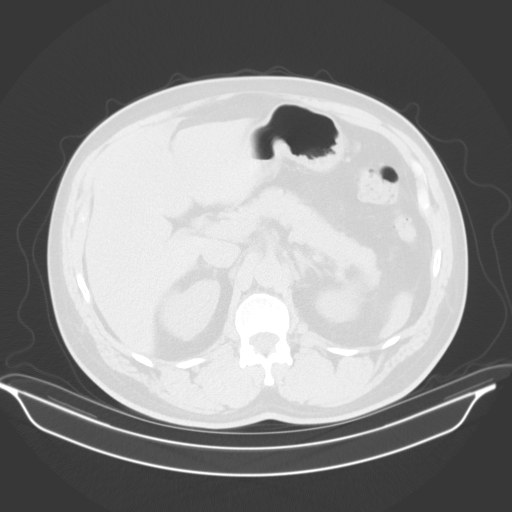
[im 74/88  soft-tissue]
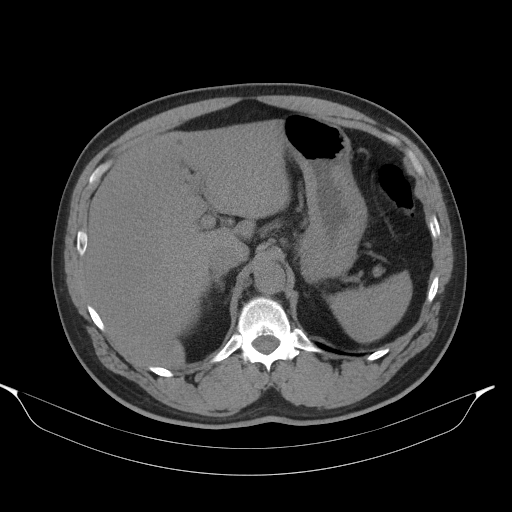
[im 74/88  lung]
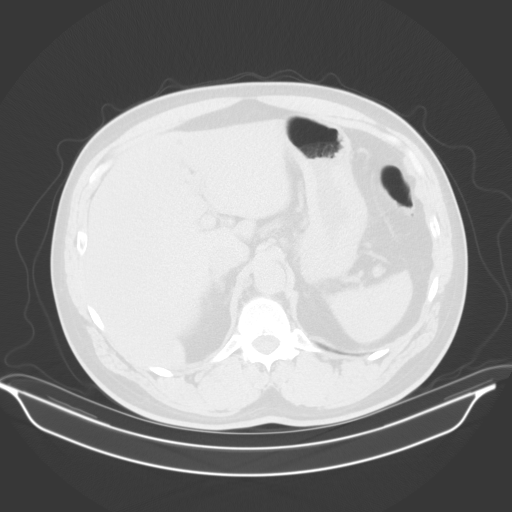
[im 79/88  lung]
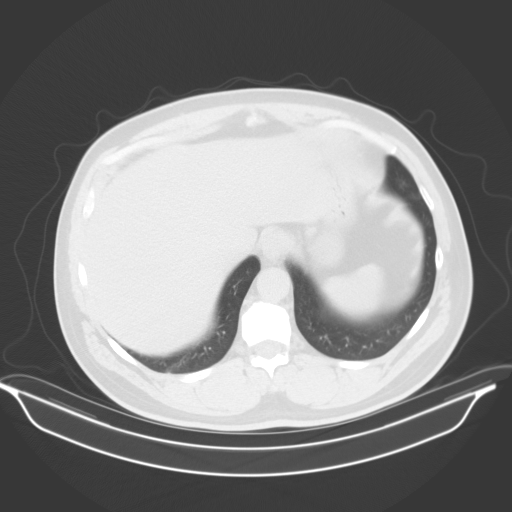
[im 83/88  soft-tissue]
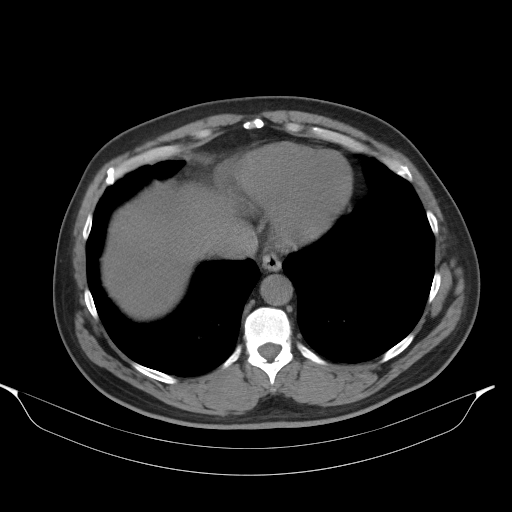
[im 83/88  lung]
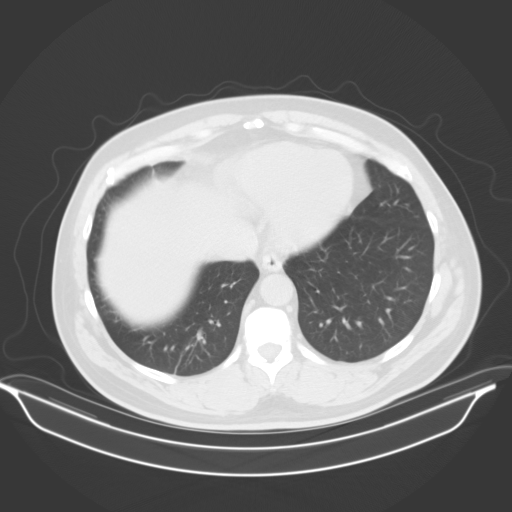

[14 of 32 positions shown; findings below may reference images not displayed]

FINDINGS: Lower chest: Lung bases are clear.

Hepatobiliary: Mild hepatic steatosis. Sub 5 mm cyst in the left
hepatic lobe.

Gallbladder is unremarkable. No intrahepatic or extrahepatic ductal
dilatation.

Pancreas: Within normal limits.

Spleen: Within normal limits.

Adrenals/Urinary Tract: Adrenal glands are within normal limits.

Kidneys are within normal limits. No renal, ureteral, or bladder
calculi. No hydronephrosis.

Bladder is within normal limits.

Stomach/Bowel: Stomach is within normal limits.

No evidence of bowel obstruction.

Appendix is not discretely visualized.

Left colonic diverticulosis, with pericolonic inflammatory changes
involving the proximal sigmoid colon in the left lower quadrant
(series 2/image 38), reflecting mild sigmoid diverticulitis.

No drainable fluid collection/abscess. No free air to suggest
macroscopic perforation.

Vascular/Lymphatic: No evidence of abdominal aortic aneurysm.

Atherosclerotic calcifications of the abdominal aorta and branch
vessels.

No suspicious abdominopelvic lymphadenopathy.

Reproductive: Prostate is unremarkable.

Other: No abdominopelvic ascites.

Musculoskeletal: Visualized osseous structures are within normal
limits.
IMPRESSION: Mild sigmoid diverticulitis. No drainable fluid collection/abscess.
No free air.

These results will be called to the ordering clinician or
representative by the Radiologist Assistant, and communication
documented in the PACS or zVision Dashboard.

## 2019-12-01 ENCOUNTER — Other Ambulatory Visit: Payer: Self-pay

## 2019-12-01 ENCOUNTER — Other Ambulatory Visit (INDEPENDENT_AMBULATORY_CARE_PROVIDER_SITE_OTHER): Payer: 59

## 2019-12-01 ENCOUNTER — Ambulatory Visit: Payer: 59 | Admitting: Nurse Practitioner

## 2019-12-01 ENCOUNTER — Emergency Department (HOSPITAL_COMMUNITY): Admission: EM | Admit: 2019-12-01 | Discharge: 2019-12-01 | Discharge status: Absent without leave | Payer: 59

## 2019-12-01 ENCOUNTER — Ambulatory Visit (HOSPITAL_COMMUNITY)
Admission: RE | Admit: 2019-12-01 | Discharge: 2019-12-01 | Disposition: A | Discharge status: Home / Self Care | Payer: 59 | Source: Ambulatory Visit | Attending: Nurse Practitioner | Admitting: Nurse Practitioner

## 2019-12-01 ENCOUNTER — Encounter: Payer: Self-pay | Admitting: Nurse Practitioner

## 2019-12-01 VITALS — BP 142/80 | HR 72 | Temp 98.5°F | Ht 71.0 in | Wt 226.0 lb

## 2019-12-01 DIAGNOSIS — Z8719 Personal history of other diseases of the digestive system: Secondary | ICD-10-CM

## 2019-12-01 DIAGNOSIS — R1010 Upper abdominal pain, unspecified: Secondary | ICD-10-CM

## 2019-12-01 DIAGNOSIS — R1030 Lower abdominal pain, unspecified: Secondary | ICD-10-CM

## 2019-12-01 DIAGNOSIS — Z01818 Encounter for other preprocedural examination: Secondary | ICD-10-CM | POA: Diagnosis not present

## 2019-12-01 LAB — BASIC METABOLIC PANEL
BUN: 18 mg/dL (ref 6–23)
CO2: 28 mEq/L (ref 19–32)
Calcium: 9.3 mg/dL (ref 8.4–10.5)
Chloride: 105 mEq/L (ref 96–112)
Creatinine, Ser: 1.03 mg/dL (ref 0.40–1.50)
GFR: 90.09 mL/min (ref >60.00)
Glucose, Bld: 98 mg/dL (ref 70–99)
Potassium: 3.9 mEq/L (ref 3.5–5.1)
Sodium: 139 mEq/L (ref 135–145)

## 2019-12-01 LAB — CBC WITH DIFFERENTIAL/PLATELET
Basophils Absolute: 0 10*3/uL (ref 0.0–0.1)
Basophils Relative: 0.3 % (ref 0.0–3.0)
Eosinophils Absolute: 0 10*3/uL (ref 0.0–0.7)
Eosinophils Relative: 0.6 % (ref 0.0–5.0)
HCT: 46.1 % (ref 39.0–52.0)
Hemoglobin: 15.4 g/dL (ref 13.0–17.0)
Lymphocytes Relative: 52.8 % — ABNORMAL HIGH (ref 12.0–46.0)
Lymphs Abs: 3.9 10*3/uL (ref 0.7–4.0)
MCHC: 33.5 g/dL (ref 30.0–36.0)
MCV: 83.6 fl (ref 78.0–100.0)
Monocytes Absolute: 0.8 10*3/uL (ref 0.1–1.0)
Monocytes Relative: 11 % (ref 3.0–12.0)
Neutro Abs: 2.6 10*3/uL (ref 1.4–7.7)
Neutrophils Relative %: 35.3 % — ABNORMAL LOW (ref 43.0–77.0)
Platelets: 288 10*3/uL (ref 150.0–400.0)
RBC: 5.51 Mil/uL (ref 4.22–5.81)
RDW: 14.2 % (ref 11.5–15.5)
WBC: 7.5 10*3/uL (ref 4.0–10.5)

## 2019-12-01 LAB — C-REACTIVE PROTEIN: CRP: <1 mg/dL (ref 0.5–20.0)

## 2019-12-01 IMAGING — CT CT ABD-PELV W/ CM
2 of 5 series · 17 of 46 positions shown, 19 images · IV contrast (omnipaque)
Comparison: [DATE]

CLINICAL DATA: Upper and lower abdominal pain.

EXAM:
CT ABDOMEN AND PELVIS WITH CONTRAST
TECHNIQUE: Multidetector CT imaging of the abdomen and pelvis was performed
using the standard protocol following bolus administration of
intravenous contrast.
CONTRAST:  100mL OMNIPAQUE IOHEXOL 300 MG/ML  SOLN

[Series 2: axial st · axial · 0.72mm/px · z∈[-494,-118]mm · 14 of 87 slices shown, 16 images]
[im 6/87  soft-tissue]
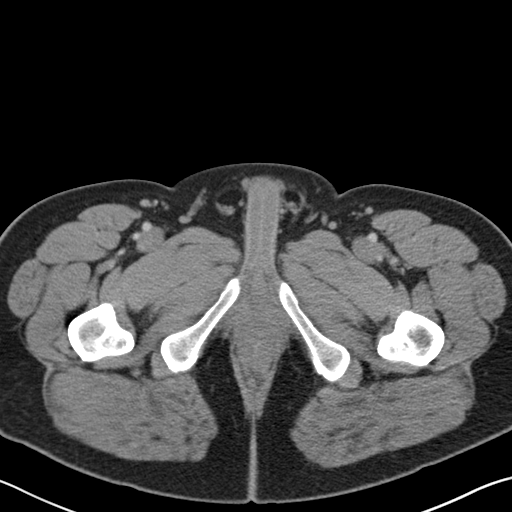
[im 6/87  bone]
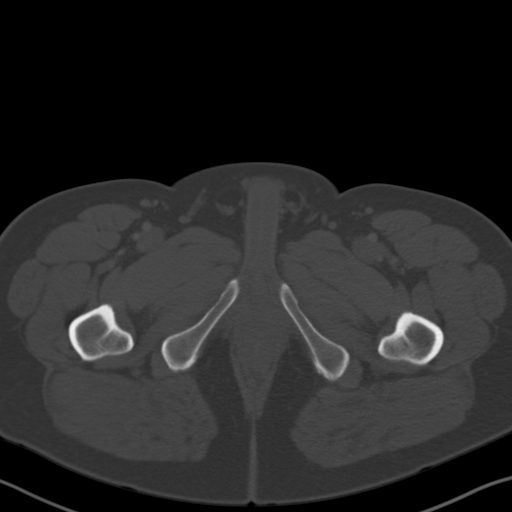
[im 11/87  soft-tissue]
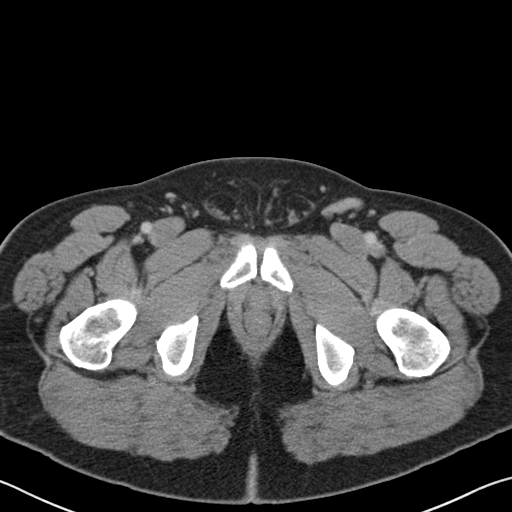
[im 16/87  soft-tissue]
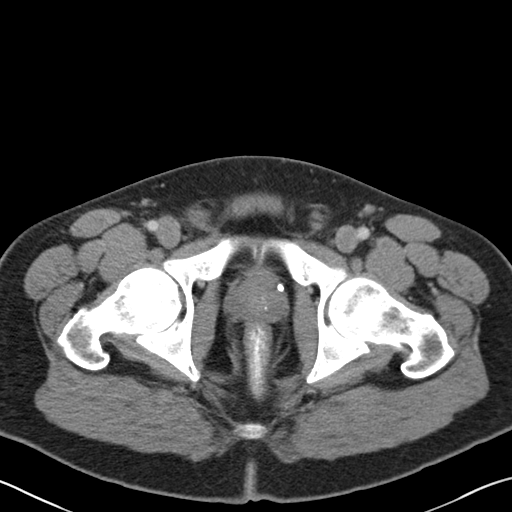
[im 26/87  soft-tissue]
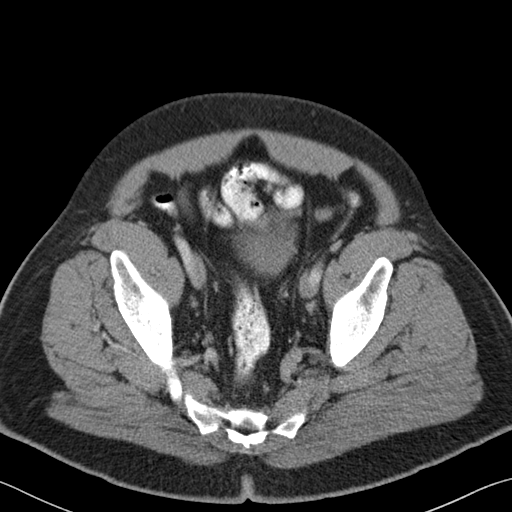
[im 31/87  soft-tissue]
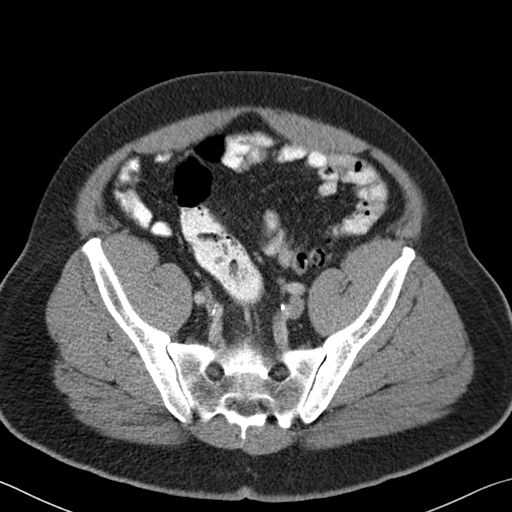
[im 36/87  soft-tissue]
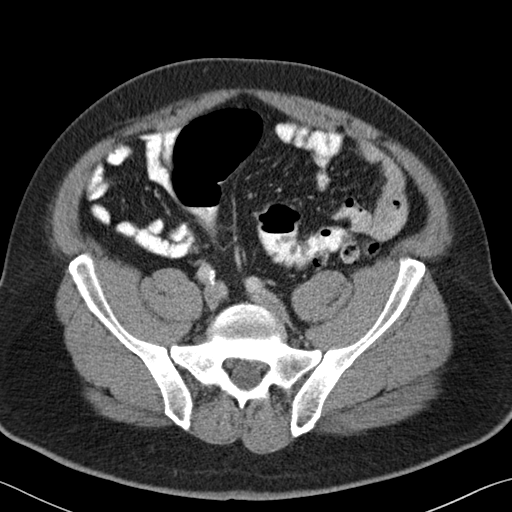
[im 41/87  soft-tissue]
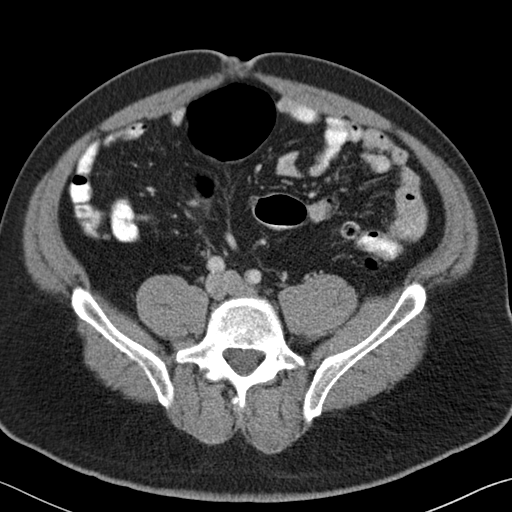
[im 46/87  soft-tissue]
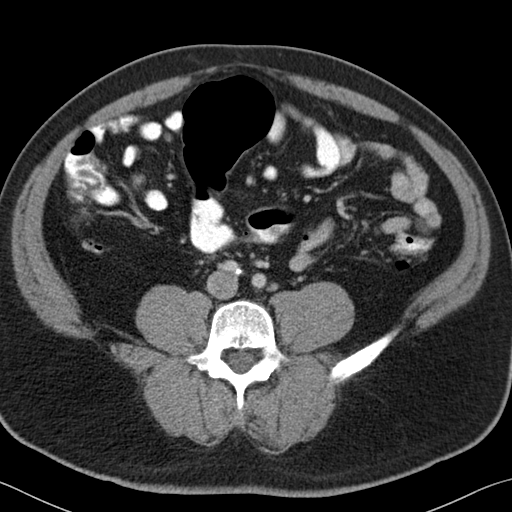
[im 51/87  soft-tissue]
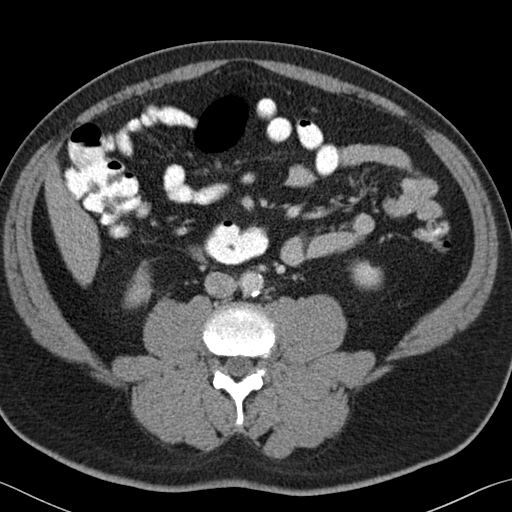
[im 51/87  bone]
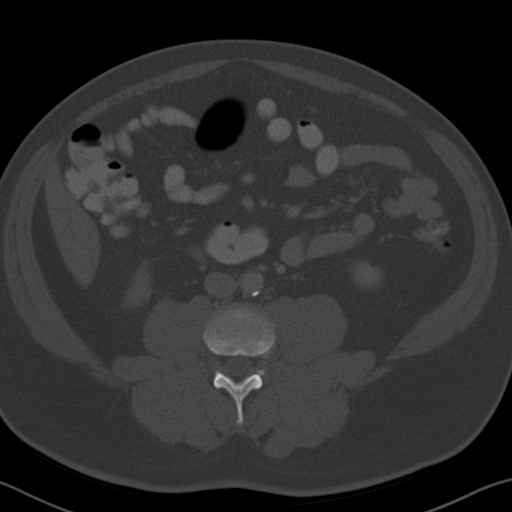
[im 56/87  soft-tissue]
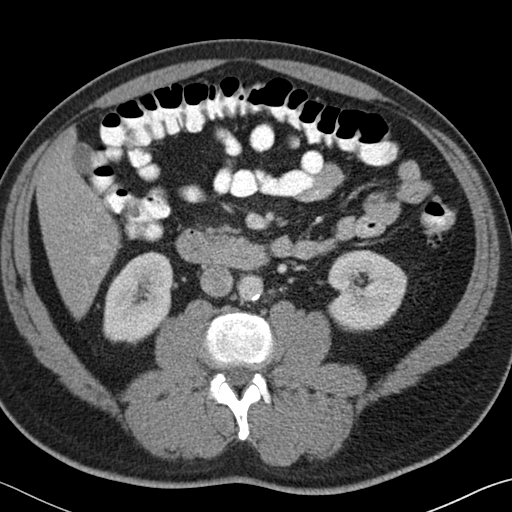
[im 66/87  soft-tissue]
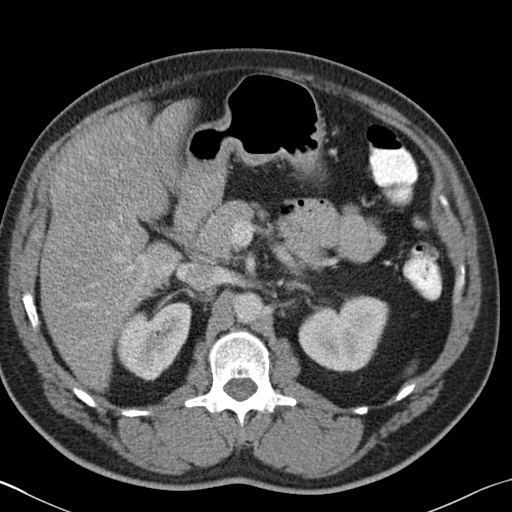
[im 71/87  soft-tissue]
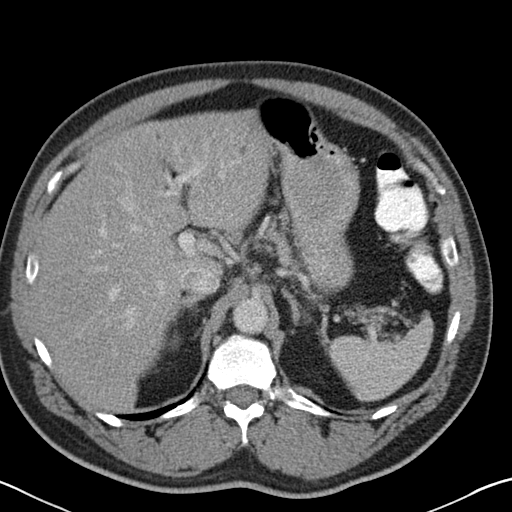
[im 76/87  soft-tissue]
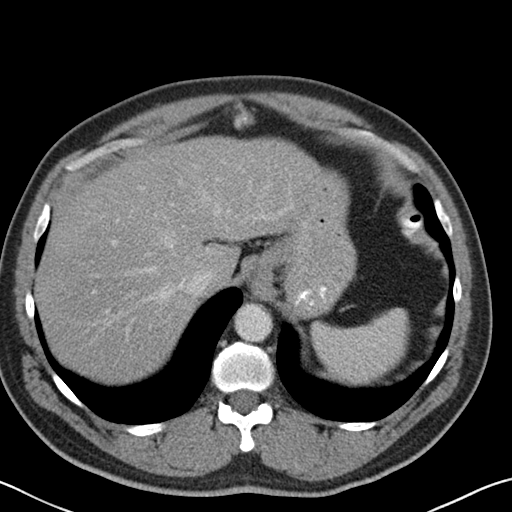
[im 81/87  soft-tissue]
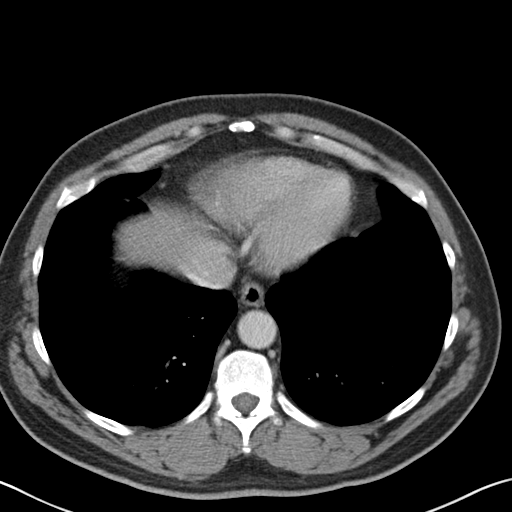

[Series 5: coronal st · coronal · 0.73mm/px · 3 of 151 slices shown]
[im 51/151  soft-tissue]
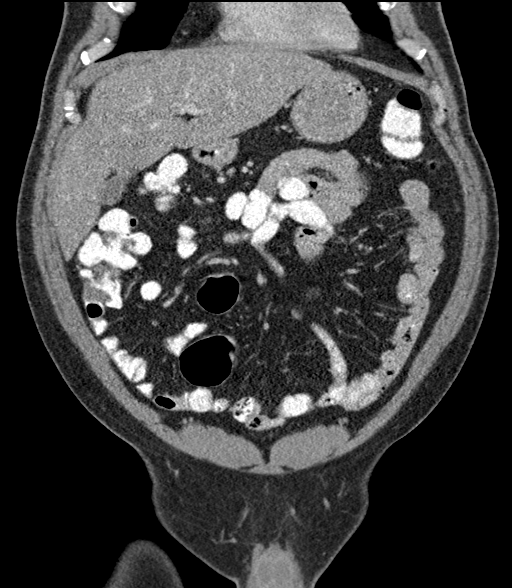
[im 67/151  soft-tissue]
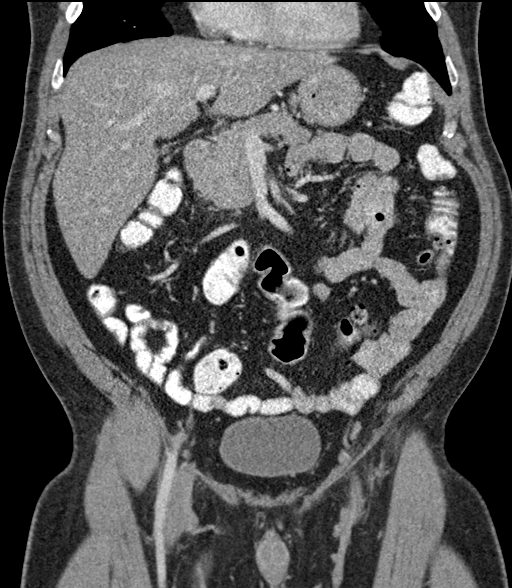
[im 84/151  soft-tissue]
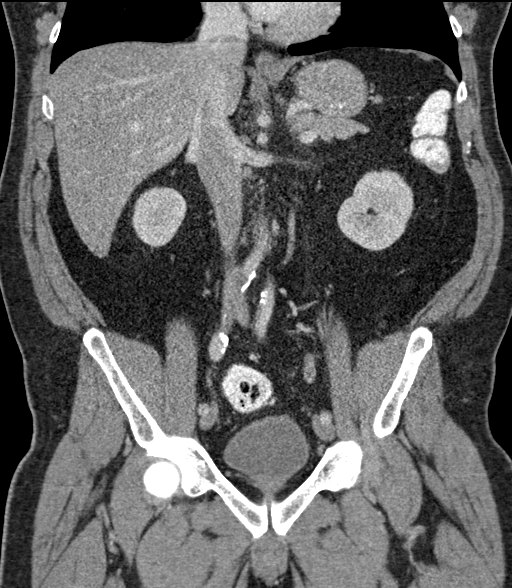

[17 of 46 positions shown; findings below may reference images not displayed]

FINDINGS: Lower chest: Lung bases are clear. No effusions. Heart is normal
size.

Hepatobiliary: Diffuse low-density throughout the liver compatible
with fatty infiltration. No focal abnormality. Gallbladder
unremarkable.

Pancreas: No focal abnormality or ductal dilatation.

Spleen: No focal abnormality.  Normal size.

Adrenals/Urinary Tract: No adrenal abnormality. No focal renal
abnormality. No stones or hydronephrosis. Urinary bladder is
unremarkable.

Stomach/Bowel: Left colonic diverticulosis. No active
diverticulitis. Normal appendix. Stomach and small bowel
decompressed, unremarkable.

Vascular/Lymphatic: Aortic atherosclerosis. No enlarged abdominal or
pelvic lymph nodes.

Reproductive: No visible focal abnormality.

Other: No free fluid or free air.

Musculoskeletal: No acute bony abnormality.
IMPRESSION: Left colonic diverticulosis.  No active diverticulitis.

Fatty infiltration of the liver.

Aortic atherosclerosis.

No acute findings.

## 2019-12-01 MED ORDER — IOHEXOL 9 MG/ML PO SOLN
500.0000 mL | ORAL | Status: AC
  Administered 2019-12-01: 500 mL via ORAL

## 2019-12-01 MED ORDER — NA SULFATE-K SULFATE-MG SULF 17.5-3.13-1.6 GM/177ML PO SOLN: 1.0000 | Freq: Once | ORAL | 0 refills | Status: AC

## 2019-12-01 MED ORDER — OMEPRAZOLE 20 MG PO CPDR: 20.0000 mg | DELAYED_RELEASE_CAPSULE | Freq: Every day | ORAL | 3 refills | Status: DC

## 2019-12-01 MED ORDER — DICYCLOMINE HCL 10 MG PO CAPS: 20.0000 mg | ORAL_CAPSULE | Freq: Three times a day (TID) | ORAL | 0 refills | Status: DC | PRN

## 2019-12-01 MED ORDER — IOHEXOL 300 MG/ML  SOLN
100.0000 mL | Freq: Once | INTRAMUSCULAR | Status: AC | PRN
  Administered 2019-12-01: 18:00:00 100 mL via INTRAVENOUS

## 2019-12-01 MED ORDER — DIATRIZOATE MEGLUMINE & SODIUM 66-10 % PO SOLN
500.0000 mL | Freq: Once | ORAL | Status: DC
  Filled 2019-12-01: qty 510

## 2019-12-01 NOTE — Progress Notes (Signed)
12/01/2019 Asheton Scheffler [id number] [birth year]   HISTORY OF PRESENT ILLNESS:Nazir Franko is a 59 year old male with a past medical history of hyperlipidemia and diverticulitis. He presents today for further evaluation for left mid abdominal pan which started abruptly after he ate shrimp and vegetables. No associated nausea or vomiting. He continued to have severe left mid abdominal pain for the next 3 days, he almost went to the ER. He saw his PCP on 11/24/2019 and at that time, he had tenderness to the LLQ on exam. He was advised to follow up in our office. He had LLQ abdominal pain 08/01/2019 with a WBC 12.4. An abd/pelvic CT showed mild sigmoid diverticulitis. He stated taking an antibiotic and his abdominal pain resolved. When asked if his current abdominal pain is similar to when he was diagnosed with diverticulitis he stated his current pain is somewhat similar. However, the majority of his current pain is to the upper abdomen and left mid abdomen, no LLQ pain. His upper abdominal pain sometimes radiates up between his shoulder blades. No history of gallstones No N/V. No fever. He had sweats for a few nights last week. He is passing a normal brown BM daily. No rectal bleeding or melena. He underwent a colonoscopy in 09/06/2014 by Dr. Ardis Hughs which showed  diverticular changes in the left colon, tortuosity of the lumen, no polyps, 10 year recall. He occasionally takes Ibuprofen '600mg'$  one tab for aches and pains, approximately 2 to 3 tabs in the past month. No other complaints today.   FH: no family history of esophageal, gastric or colon cancer.  SH: Married. He has 5 grown children. He smokes < 1ppd x 30 yrs. No alcohol or drug use.   Past Medical History:  Diagnosis Date  . Hyperlipidemia    Past Surgical History:  Procedure Laterality Date  . COLONOSCOPY N/A 09/06/2014   Procedure: COLONOSCOPY;  Surgeon: [name], MD;  Location: [facility];  Service: Endoscopy;   Laterality: N/A;  . KNEE ARTHROSCOPY      reports that he has been smoking cigarettes. He has never used smokeless tobacco. He reports that he does not drink alcohol or use drugs. family history includes Diverticulitis in his sister. No Known Allergies    Outpatient Encounter Medications as of 12/01/2019  Medication Sig  . atorvastatin (LIPITOR) 20 MG tablet Take 20 mg by mouth daily.  . [DISCONTINUED] diclofenac (VOLTAREN) 75 MG EC tablet Take 1 tablet (75 mg total) by mouth 2 (two) times daily.  Marland Kitchen dicyclomine (BENTYL) 10 MG capsule Take 2 capsules (20 mg total) by mouth every 8 (eight) hours as needed for spasms.  . Na Sulfate-K Sulfate-Mg Sulf 17.5-3.13-1.6 GM/177ML SOLN Take 1 kit by mouth once for 1 dose.  Marland Kitchen omeprazole (PRILOSEC) 20 MG capsule Take 1 capsule (20 mg total) by mouth daily.   No facility-administered encounter medications on file as of 12/01/2019.     REVIEW OF SYSTEMS  : All other systems reviewed and negative except where noted in the History of Present Illness.  PHYSICAL EXAM: BP (!) 142/80   Pulse 72   Temp 98.5 F (36.9 C)   Ht '5\' 11"'$  (1.803 m)   Wt 226 lb (102.5 kg)   BMI 31.52 kg/m  General: Well developed  59 year old male in no acute distress. Head: Normocephalic and atraumatic. Eyes:  Sclerae non-icteric, conjunctive pink. Ears: Normal auditory acuity. Neck: Supple, no lymphadenopathy or thyromegaly.  Lungs: Clear bilaterally to auscultation  without wheezes, crackles or rhonchi. Heart: Regular rate and rhythm. No murmur, rub or gallop appreciated.  Abdomen: Soft, non distended. Moderate tenderness left mid abdomen, epigastric and central lower abdomen without rebound or guarding.  No masses. No hepatosplenomegaly. Normoactive bowel sounds x 4 quadrants.  Rectal: Deferred.  Musculoskeletal: Symmetrical with no gross deformities. Skin: Warm and dry. No rash or lesions on visible extremities. Extremities: No edema. Neurological: Alert oriented x 4,  no focal deficits.  Psychological:  Alert and cooperative. Normal mood and affect.  ASSESSMENT AND PLAN:  12. 59 year old male with a history of sigmoid diverticulitis 07/2019 presents with left mid abdominal pain + variable upper abdominal pain which radiates between the shoulder blades (suggestive of biliary etiology) and no significant lower abdominal pain. On exam, mild left mid abdominal tenderness without LLQ tenderness.  -CTAP with contrast -CBC, BMP and CRP  -EGD and colonoscopy to be scheduled after CTAP results received. If the patient had diverticulitis then procedures to be scheduled in 6 to 8 weeks. EGD/colonoscpy benefits and risks discussed including risks with sedation, risk of bleeding, perforation and infection. -PPI po QD -Dicyclomine '20mg'$  po Q 8 hrs -To the ED if sx worsen     CC:  Jilda Panda, MD

## 2019-12-01 NOTE — Patient Instructions (Addendum)
If you are age 57 or older, your body mass index should be between 23-30. Your Body mass index is 31.52 kg/m. If this is out of the aforementioned range listed, please consider follow up with your Primary Care Provider.  If you are age 9 or younger, your body mass index should be between 19-25. Your Body mass index is 31.52 kg/m. If this is out of the aformentioned range listed, please consider follow up with your Primary Care Provider.   Your provider has requested that you go to the basement level for lab work before leaving today. Press "B" on the elevator. The lab is located at the first door on the left as you exit the elevator.  CT abdomen and pelvis now at [facility].   We have sent the following medications to your pharmacy for you to pick up at your convenience: 1. Start Omeprazole 20 mg daily. 2. Dicyclomine 20 mg every 8 hours as needed.  Go to ER if abdominal pain gets worse.   Further recommendations after CT scan.

## 2019-12-03 NOTE — Progress Notes (Signed)
I agree with the above note, plan 

## 2019-12-18 DIAGNOSIS — D126 Benign neoplasm of colon, unspecified: Secondary | ICD-10-CM

## 2019-12-18 HISTORY — DX: Benign neoplasm of colon, unspecified: D12.6

## 2019-12-28 ENCOUNTER — Other Ambulatory Visit: Payer: Self-pay | Admitting: Gastroenterology

## 2019-12-28 ENCOUNTER — Ambulatory Visit (INDEPENDENT_AMBULATORY_CARE_PROVIDER_SITE_OTHER): Payer: 59

## 2019-12-28 DIAGNOSIS — Z1159 Encounter for screening for other viral diseases: Secondary | ICD-10-CM

## 2019-12-29 LAB — SARS CORONAVIRUS 2 (TAT 6-24 HRS): SARS Coronavirus 2: NEGATIVE

## 2020-01-01 ENCOUNTER — Encounter: Payer: Self-pay | Admitting: Gastroenterology

## 2020-01-01 ENCOUNTER — Other Ambulatory Visit: Payer: Self-pay

## 2020-01-01 ENCOUNTER — Ambulatory Visit (AMBULATORY_SURGERY_CENTER): Payer: 59 | Admitting: Gastroenterology

## 2020-01-01 VITALS — BP 144/94 | HR 65 | Temp 97.8°F | Resp 12 | Ht 71.0 in | Wt 226.0 lb

## 2020-01-01 DIAGNOSIS — Z8719 Personal history of other diseases of the digestive system: Secondary | ICD-10-CM

## 2020-01-01 DIAGNOSIS — K5730 Diverticulosis of large intestine without perforation or abscess without bleeding: Secondary | ICD-10-CM | POA: Diagnosis present

## 2020-01-01 DIAGNOSIS — D123 Benign neoplasm of transverse colon: Secondary | ICD-10-CM | POA: Diagnosis not present

## 2020-01-01 DIAGNOSIS — K2950 Unspecified chronic gastritis without bleeding: Secondary | ICD-10-CM

## 2020-01-01 DIAGNOSIS — D124 Benign neoplasm of descending colon: Secondary | ICD-10-CM

## 2020-01-01 DIAGNOSIS — K2970 Gastritis, unspecified, without bleeding: Secondary | ICD-10-CM

## 2020-01-01 DIAGNOSIS — R1010 Upper abdominal pain, unspecified: Secondary | ICD-10-CM

## 2020-01-01 MED ORDER — SODIUM CHLORIDE 0.9 % IV SOLN: 500.0000 mL | Freq: Once | INTRAVENOUS | Status: DC

## 2020-01-01 NOTE — Progress Notes (Signed)
Called to room to assist during endoscopic procedure.  Patient ID and intended procedure confirmed with present staff. Received instructions for my participation in the procedure from the performing physician.  

## 2020-01-01 NOTE — Progress Notes (Signed)
Temp-LC VS-DT 

## 2020-01-01 NOTE — Progress Notes (Signed)
Report to PACU, RN, vss, BBS= Clear.  

## 2020-01-01 NOTE — Op Note (Signed)
[place] Patient Name: Gadsden Caez Procedure Date: 01/01/2020 1:54 PM MRN: [id number] Endoscopist: [name] , MD Age: 59 Referring MD:  Date of Birth: 04-23-1961 Gender: Male Account #: [id number] Procedure:                Colonoscopy Indications:              Generalized abdominal pain, recent CT proven acute                            diverticulitis Medicines:                Monitored Anesthesia Care Procedure:                Pre-Anesthesia Assessment:                           - Prior to the procedure, a History and Physical                            was performed, and patient medications and                            allergies were reviewed. The patient's tolerance of                            previous anesthesia was also reviewed. The risks                            and benefits of the procedure and the sedation                            options and risks were discussed with the patient.                            All questions were answered, and informed consent                            was obtained. Prior Anticoagulants: The patient has                            taken no previous anticoagulant or antiplatelet                            agents. ASA Grade Assessment: II - A patient with                            mild systemic disease. After reviewing the risks                            and benefits, the patient was deemed in                            satisfactory condition to undergo the procedure.  After obtaining informed consent, the colonoscope                            was passed under direct vision. Throughout the                            procedure, the patient's blood pressure, pulse, and                            oxygen saturations were monitored continuously. The                            Colonoscope was introduced through the anus and                            advanced to the the cecum, identified by                            appendiceal orifice and ileocecal valve. The                            colonoscopy was performed without difficulty. The                            patient tolerated the procedure well. The quality                            of the bowel preparation was good. Scope In: 1:30:50 PM Scope Out: 1:46:22 PM Scope Withdrawal Time: 0 hours 9 minutes 9 seconds  Total Procedure Duration: 0 hours 15 minutes 32 seconds  Findings:                 Two sessile polyps were found in the descending                            colon and transverse colon. The polyps were 2 to 3                            mm in size. These polyps were removed with a cold                            snare. Resection and retrieval were complete.                           Multiple small and large-mouthed diverticula were                            found in the left colon.                           The exam was otherwise without abnormality on                            direct and retroflexion views. Complications:  No immediate complications. Estimated blood loss:                            None. Estimated Blood Loss:     Estimated blood loss: none. Impression:               - Two 2 to 3 mm polyps in the descending colon and                            in the transverse colon, removed with a cold snare.                            Resected and retrieved.                           - Diverticulosis in the left colon.                           - The examination was otherwise normal on direct                            and retroflexion views. Recommendation:           - Patient has a contact number available for                            emergencies. The signs and symptoms of potential                            delayed complications were discussed with the                            patient. Return to normal activities tomorrow.                            Written discharge instructions were  provided to the                            patient.                           - Resume previous diet.                           - Continue present medications.                           - Await pathology results. [name], MD 01/01/2020 1:59:17 PM This report has been signed electronically.

## 2020-01-01 NOTE — Patient Instructions (Signed)
Thank you for allowing [place] to care for you today!  Await pathology results, approximately 2 weeks by mail.  Will make recommendations at that time.  Resume previous diet and medications today.  Return to your normal activities tomorrow.     YOU HAD AN ENDOSCOPIC PROCEDURE TODAY AT [facility]:   Refer to the procedure report that was given to you for any specific questions about what was found during the examination.  If the procedure report does not answer your questions, please call your gastroenterologist to clarify.  If you requested that your care partner not be given the details of your procedure findings, then the procedure report has been included in a sealed envelope for you to review at your convenience later.  YOU SHOULD EXPECT: Some feelings of bloating in the abdomen. Passage of more gas than usual.  Walking can help get rid of the air that was put into your GI tract during the procedure and reduce the bloating. If you had a lower endoscopy (such as a colonoscopy or flexible sigmoidoscopy) you may notice spotting of blood in your stool or on the toilet paper. If you underwent a bowel prep for your procedure, you may not have a normal bowel movement for a few days.  Please Note:  You might notice some irritation and congestion in your nose or some drainage.  This is from the oxygen used during your procedure.  There is no need for concern and it should clear up in a day or so.  SYMPTOMS TO REPORT IMMEDIATELY:   Following lower endoscopy (colonoscopy or flexible sigmoidoscopy):  Excessive amounts of blood in the stool  Significant tenderness or worsening of abdominal pains  Swelling of the abdomen that is new, acute  Fever of 100F or higher   Following upper endoscopy (EGD)  Vomiting of blood or coffee ground material  New chest pain or pain under the shoulder blades  Painful or persistently difficult swallowing  New shortness of breath  Fever of 100F or  higher  Black, tarry-looking stools  For urgent or emergent issues, a gastroenterologist can be reached at any hour by calling [phone]. Do not use MyChart messaging for urgent concerns.    DIET:  We do recommend a small meal at first, but then you may proceed to your regular diet.  Drink plenty of fluids but you should avoid alcoholic beverages for 24 hours.  ACTIVITY:  You should plan to take it easy for the rest of today and you should NOT DRIVE or use heavy machinery until tomorrow (because of the sedation medicines used during the test).    FOLLOW UP: Our staff will call the number listed on your records 48-72 hours following your procedure to check on you and address any questions or concerns that you may have regarding the information given to you following your procedure. If we do not reach you, we will leave a message.  We will attempt to reach you two times.  During this call, we will ask if you have developed any symptoms of COVID 19. If you develop any symptoms (ie: fever, flu-like symptoms, shortness of breath, cough etc.) before then, please call [phone].  If you test positive for Covid 19 in the 2 weeks post procedure, please call and report this information to [place].    If any biopsies were taken you will be contacted by phone or by letter within the next 1-3 weeks.  Please call us at [phone] if  you have not heard about the biopsies in 3 weeks.    SIGNATURES/CONFIDENTIALITY: You and/or your care partner have signed paperwork which will be entered into your electronic medical record.  These signatures attest to the fact that that the information above on your After Visit Summary has been reviewed and is understood.  Full responsibility of the confidentiality of this discharge information lies with you and/or your care-partner.

## 2020-01-01 NOTE — Op Note (Signed)
Darrell Wyatt: Darrell Wyatt Procedure Date: 01/01/2020 1:23 PM MRN: [id number] Endoscopist: [name] , MD Age: 59 Referring MD:  Date of Birth: May 08, 1961 Gender: Male Account #: [id number] Procedure:                Upper GI endoscopy Indications:              Generalized abdominal pain Medicines:                Monitored Anesthesia Care Procedure:                Pre-Anesthesia Assessment:                           - Prior to the procedure, a History and Physical                            was performed, and patient medications and                            allergies were reviewed. The patient's tolerance of                            previous anesthesia was also reviewed. The risks                            and benefits of the procedure and the sedation                            options and risks were discussed with the patient.                            All questions were answered, and informed consent                            was obtained. Prior Anticoagulants: The patient has                            taken no previous anticoagulant or antiplatelet                            agents. ASA Grade Assessment: II - A patient with                            mild systemic disease. After reviewing the risks                            and benefits, the patient was deemed in                            satisfactory condition to undergo the procedure.                           After obtaining informed consent, the endoscope was  passed under direct vision. Throughout the                            procedure, the patient's blood pressure, pulse, and                            oxygen saturations were monitored continuously. The                            Endoscope was introduced through the mouth, and                            advanced to the second part of duodenum. The upper                            GI endoscopy was  accomplished without difficulty.                            The patient tolerated the procedure well. Scope In: 1:30:09 PM Scope Out: 1:46:56 PM Scope Withdrawal Time: 0 hours 9 minutes 48 seconds  Total Procedure Duration: 0 hours 16 minutes 47 seconds  Findings:                 Mild inflammation characterized by erythema and                            granularity was found in the gastric antrum.                            Biopsies were taken with a cold forceps for                            histology.                           The exam was otherwise without abnormality. Complications:            No immediate complications. Estimated blood loss:                            None. Estimated Blood Loss:     Estimated blood loss: none. Impression:               - Mild non-specific gastritis, biopsied to check                            for H. pylori.                           - The examination was otherwise normal. Recommendation:           - Patient has a contact number available for                            emergencies. The signs and symptoms of potential  delayed complications were discussed with the                            patient. Return to normal activities tomorrow.                            Written discharge instructions were provided to the                            patient.                           - Resume previous diet.                           - Continue present medications.                           - Await pathology results. [name], MD 01/01/2020 2:01:03 PM This report has been signed electronically.

## 2020-01-02 ENCOUNTER — Telehealth: Payer: Self-pay | Admitting: *Deleted

## 2020-01-02 NOTE — Telephone Encounter (Signed)
Follow up call made, left message. 

## 2020-01-03 ENCOUNTER — Telehealth: Payer: Self-pay

## 2020-01-03 NOTE — Telephone Encounter (Signed)
  Follow up Call-  Call back number 01/01/2020  Post procedure Call Back phone  # [phone]  Permission to leave phone message Yes  Some recent data might be hidden     Patient questions:  Do you have a fever, pain , or abdominal swelling? No. Pain Score  0 *  Have you tolerated food without any problems? Yes.    Have you been able to return to your normal activities? Yes.    Do you have any questions about your discharge instructions: Diet   No. Medications  No. Follow up visit  No.  Do you have questions or concerns about your Care? No.  Actions: * If pain score is 4 or above: No action needed, pain <4.  1. Have you developed a fever since your procedure? no  2.   Have you had an respiratory symptoms (SOB or cough) since your procedure? no  3.   Have you tested positive for COVID 19 since your procedure no  4.   Have you had any family members/close contacts diagnosed with the COVID 19 since your procedure?  no   If yes to any of these questions please route to Joylene John, RN and Alphonsa Gin, Therapist, sports.

## 2020-01-08 ENCOUNTER — Encounter: Payer: Self-pay | Admitting: Gastroenterology

## 2020-01-22 ENCOUNTER — Other Ambulatory Visit (INDEPENDENT_AMBULATORY_CARE_PROVIDER_SITE_OTHER): Payer: 59

## 2020-01-22 ENCOUNTER — Telehealth: Payer: Self-pay | Admitting: Gastroenterology

## 2020-01-22 DIAGNOSIS — R1010 Upper abdominal pain, unspecified: Secondary | ICD-10-CM | POA: Diagnosis not present

## 2020-01-22 DIAGNOSIS — R1032 Left lower quadrant pain: Secondary | ICD-10-CM | POA: Diagnosis not present

## 2020-01-22 DIAGNOSIS — R112 Nausea with vomiting, unspecified: Secondary | ICD-10-CM

## 2020-01-22 LAB — CBC WITH DIFFERENTIAL/PLATELET
Basophils Absolute: 0 10*3/uL (ref 0.0–0.1)
Basophils Relative: 0.5 % (ref 0.0–3.0)
Eosinophils Absolute: 0.2 10*3/uL (ref 0.0–0.7)
Eosinophils Relative: 1.9 % (ref 0.0–5.0)
HCT: 40.9 % (ref 39.0–52.0)
Hemoglobin: 13.9 g/dL (ref 13.0–17.0)
Lymphocytes Relative: 40.1 % (ref 12.0–46.0)
Lymphs Abs: 3.6 10*3/uL (ref 0.7–4.0)
MCHC: 34.1 g/dL (ref 30.0–36.0)
MCV: 83 fl (ref 78.0–100.0)
Monocytes Absolute: 0.6 10*3/uL (ref 0.1–1.0)
Monocytes Relative: 7 % (ref 3.0–12.0)
Neutro Abs: 4.6 10*3/uL (ref 1.4–7.7)
Neutrophils Relative %: 50.5 % (ref 43.0–77.0)
Platelets: 378 10*3/uL (ref 150.0–400.0)
RBC: 4.93 Mil/uL (ref 4.22–5.81)
RDW: 13.9 % (ref 11.5–15.5)
WBC: 9 10*3/uL (ref 4.0–10.5)

## 2020-01-22 LAB — COMPREHENSIVE METABOLIC PANEL
ALT: 13 U/L (ref 0–53)
AST: 12 U/L (ref 0–37)
Albumin: 4.2 g/dL (ref 3.5–5.2)
Alkaline Phosphatase: 62 U/L (ref 39–117)
BUN: 15 mg/dL (ref 6–23)
CO2: 24 mEq/L (ref 19–32)
Calcium: 9.4 mg/dL (ref 8.4–10.5)
Chloride: 105 mEq/L (ref 96–112)
Creatinine, Ser: 0.94 mg/dL (ref 0.40–1.50)
GFR: 100.07 mL/min (ref >60.00)
Glucose, Bld: 154 mg/dL — ABNORMAL HIGH (ref 70–99)
Potassium: 3.6 mEq/L (ref 3.5–5.1)
Sodium: 138 mEq/L (ref 135–145)
Total Bilirubin: 0.5 mg/dL (ref 0.2–1.2)
Total Protein: 8 g/dL (ref 6.0–8.3)

## 2020-01-22 NOTE — Telephone Encounter (Signed)
I cannot explain those symptoms.  He needs OV with myself or extender in the next 2-3 weeks.  Thanks  Lets get some labs for now: cbc, cmet.

## 2020-01-22 NOTE — Telephone Encounter (Signed)
The pt states that he has severe nausea and vomiting after eating starting 3-4 days after the recent endo colon on 01/01/20 .  He says the pain is on the left side in the middle of the abdomen that radiates to the back.  He also states he is vomiting "yellow stuff", this is new.  The pain is the same as prior to the endo colon. He is taking prilosec 20 mg daily.  He took bentyl but did not notice any relief.  Please advise

## 2020-01-22 NOTE — Telephone Encounter (Signed)
The pt has been advised and appt made.  Lab order in and pt states he will come in today

## 2020-01-30 ENCOUNTER — Ambulatory Visit: Payer: 59 | Admitting: Physician Assistant

## 2020-02-09 ENCOUNTER — Other Ambulatory Visit (INDEPENDENT_AMBULATORY_CARE_PROVIDER_SITE_OTHER): Payer: 59

## 2020-02-09 ENCOUNTER — Ambulatory Visit: Payer: 59 | Admitting: Physician Assistant

## 2020-02-09 ENCOUNTER — Encounter: Payer: Self-pay | Admitting: Physician Assistant

## 2020-02-09 ENCOUNTER — Telehealth: Payer: Self-pay

## 2020-02-09 VITALS — BP 122/70 | HR 81 | Temp 98.6°F | Ht 71.0 in | Wt 214.0 lb

## 2020-02-09 DIAGNOSIS — R1013 Epigastric pain: Secondary | ICD-10-CM | POA: Diagnosis not present

## 2020-02-09 DIAGNOSIS — R748 Abnormal levels of other serum enzymes: Secondary | ICD-10-CM

## 2020-02-09 DIAGNOSIS — K219 Gastro-esophageal reflux disease without esophagitis: Secondary | ICD-10-CM

## 2020-02-09 LAB — LIPASE: Lipase: 440 U/L — ABNORMAL HIGH (ref 11.0–59.0)

## 2020-02-09 NOTE — Progress Notes (Signed)
Chief Complaint: Nausea and vomiting, epigastric pain  HPI:    [name] is a 59 year old male with a past medical history as listed below, known to Dr. Ardis Hughs, who was referred to me by Jilda Panda, MD for a complaint of nausea, vomiting and epigastric pain.      12/01/2019 patient seen in clinic by Carl Best, NP for a left mid abdominal pain which started abruptly after he ate shrimp and vegetables.  Was seen in the ER 11/24/2019 with tenderness in left lower quadrant.    12/01/2019 CT of abdomen pelvis with no active diverticulitis, fatty infiltration of the liver and aortic atherosclerosis.  Labs were also normal.    01/01/2020 EGD and colonoscopy.  EGD with mild nonspecific gastritis.  Colonoscopy with two 2-3 mm polyps in the descending and transverse colon, diverticulosis in the left colon and otherwise normal.  Pathology with tubular adenoma and mild chronic gastritis. Repeat colonoscopy was recommended in 7 years.      01/22/2020 patient called and stated that he could not keep anything down and started throwing up yellow stuff and when he eats it hurts afterwards.  A CT was ordered as well as an EGD and colonoscopy.  Patient was also given dicyclomine 20 mg p.o. every 8 hours.  CMP and CBC were ordered and normal.    Today, the patient reports that he had continued with pain after time of his procedures and any time that he would eat about 5 to 10 minutes afterwards his abdomen would hurt and then he would vomit, this lasted for about 3 days where it was so severe.  He saw his PCP at some point who changed him from Omeprazole 20 mg to Dexilant 60 mg daily at the beginning of April, and his pain has slowly gotten better and he has no further vomiting.  He is now eating fairly normal, though "my wife has me on a diet".  Tells me he has decreased intake of greasy foods and is eating grilled chicken and healthier.  Does describe an increase in growling/gas in his stomach.  Overall is  feeling better, if he does push very hard on his epigastrium he can feel pain back through to his back.  He is having normal bowel movements though they are somewhat decreased in quantity from previous.  Also describes a weight loss of around 10 pounds.    Denies fever, chills, blood in his stool or symptoms that awaken him from sleep.  Past Medical History:  Diagnosis Date  . Hyperlipidemia     Past Surgical History:  Procedure Laterality Date  . COLONOSCOPY N/A 09/06/2014   Procedure: COLONOSCOPY;  Surgeon: [name], MD;  Location: [facility];  Service: Endoscopy;  Laterality: N/A;  . KNEE ARTHROSCOPY      Outpatient Encounter Medications as of 02/09/2020  Medication Sig  . atorvastatin (LIPITOR) 20 MG tablet Take 20 mg by mouth daily.  Marland Kitchen DEXILANT 60 MG capsule Take 1 capsule by mouth daily.  . CHANTIX STARTING MONTH PAK 0.5 MG X 11 & 1 MG X 42 tablet See admin instructions.  . [DISCONTINUED] dicyclomine (BENTYL) 10 MG capsule Take 2 capsules (20 mg total) by mouth every 8 (eight) hours as needed for spasms.  . [DISCONTINUED] omeprazole (PRILOSEC) 20 MG capsule Take 1 capsule (20 mg total) by mouth daily. (Patient not taking: Reported on 01/01/2020)   No facility-administered encounter medications on file as of 02/09/2020.    Allergies as of 02/09/2020  . (  No Known Allergies)    Family History  Problem Relation Age of Onset  . Diverticulitis Mother   . Diverticulitis Sister   . Colon cancer Neg Hx   . Esophageal cancer Neg Hx   . Rectal cancer Neg Hx   . Stomach cancer Neg Hx     Social History   Socioeconomic History  . Marital status: Married    Spouse name: Not on file  . Number of children: Not on file  . Years of education: Not on file  . Highest education level: Not on file  Occupational History  . Not on file  Tobacco Use  . Smoking status: Current Every Day Smoker    Types: Cigarettes  . Smokeless tobacco: Never Used  Substance and Sexual Activity   . Alcohol use: Yes    Comment: very rarely  . Drug use: No  . Sexual activity: Not on file  Other Topics Concern  . Not on file  Social History Narrative  . Not on file   Social Determinants of Health   Financial Resource Strain:   . Difficulty of Paying Living Expenses:   Food Insecurity:   . Worried About Charity fundraiser in the Last Year:   . Arboriculturist in the Last Year:   Transportation Needs:   . Film/video editor (Medical):   Marland Kitchen Lack of Transportation (Non-Medical):   Physical Activity:   . Days of Exercise per Week:   . Minutes of Exercise per Session:   Stress:   . Feeling of Stress :   Social Connections:   . Frequency of Communication with Friends and Family:   . Frequency of Social Gatherings with Friends and Family:   . Attends Religious Services:   . Active Member of Clubs or Organizations:   . Attends Archivist Meetings:   Marland Kitchen Marital Status:   Intimate Partner Violence:   . Fear of Current or Ex-Partner:   . Emotionally Abused:   Marland Kitchen Physically Abused:   . Sexually Abused:     Review of Systems:    Constitutional: No weight loss, fever or chills Cardiovascular: No chest pain Respiratory: No SOB  Gastrointestinal: See HPI and otherwise negative   Physical Exam:  Vital signs: BP 122/70   Pulse 81   Temp 98.6 F (37 C)   Ht 5\' 11"  (1.803 m)   Wt 214 lb (97.1 kg)   BMI 29.85 kg/m   Constitutional:   Pleasant AA male appears to be in NAD, Well developed, Well nourished, alert and cooperative Respiratory: Respirations even and unlabored. Lungs clear to auscultation bilaterally.   No wheezes, crackles, or rhonchi.  Cardiovascular: Normal S1, S2. No MRG. Regular rate and rhythm. No peripheral edema, cyanosis or pallor.  Gastrointestinal:  Soft, nondistended, mild epigastric and LLQ ttp with deep palpation. No rebound or guarding. Normal bowel sounds. No appreciable masses or hepatomegaly. Rectal:  Not performed.  Psychiatric:   Demonstrates good judgement and reason without abnormal affect or behaviors.  RELEVANT LABS AND IMAGING: CBC    Component Value Date/Time   WBC 9.0 01/22/2020 1128   RBC 4.93 01/22/2020 1128   HGB 13.9 01/22/2020 1128   HCT 40.9 01/22/2020 1128   PLT 378.0 01/22/2020 1128   MCV 83.0 01/22/2020 1128   MCHC 34.1 01/22/2020 1128   RDW 13.9 01/22/2020 1128   LYMPHSABS 3.6 01/22/2020 1128   MONOABS 0.6 01/22/2020 1128   EOSABS 0.2 01/22/2020 1128   BASOSABS 0.0  01/22/2020 1128    CMP     Component Value Date/Time   NA 138 01/22/2020 1128   K 3.6 01/22/2020 1128   CL 105 01/22/2020 1128   CO2 24 01/22/2020 1128   GLUCOSE 154 (H) 01/22/2020 1128   BUN 15 01/22/2020 1128   CREATININE 0.94 01/22/2020 1128   CALCIUM 9.4 01/22/2020 1128   PROT 8.0 01/22/2020 1128   ALBUMIN 4.2 01/22/2020 1128   AST 12 01/22/2020 1128   ALT 13 01/22/2020 1128   ALKPHOS 62 01/22/2020 1128   BILITOT 0.5 01/22/2020 1128    Assessment: 1.  Nausea and vomiting: Now better after addition of Dexilant, likely related to below 2.  Epigastric pain: Labs and imaging have been normal, EGD with chronic gastritis, patient symptoms are better on Dexilant 60 mg daily over the past couple of weeks, does continue with a small amount of epigastric discomfort with deep palpation, discussed with the patient that hopefully over the next couple of months this medicine will help to heal his stomach  Plan: 1.  Ordered lipase today as pain seems to radiate through to patient's back. 2.  Continue Dexilant 60 mg daily.  Told the patient we can refill for him if he runs out. 3.  Reviewed antireflux diet and lifestyle modifications.  Provided him with a handout. 4.  Encouraged the patient to continue his healthy eating, this is likely the reason that he is losing weight and will eventually help with his stomach symptoms over the next couple of months. 5.  Patient will call and let us know if he continues with any discomfort  after another 2 to 3 months on Dexilant.  Otherwise patient will follow up as needed.  Ellouise Newer, PA-C [facility] 02/09/2020, 10:25 AM  Cc: Jilda Panda, MD

## 2020-02-09 NOTE — Telephone Encounter (Signed)
-----   Message from Levin Erp, [place] sent at 02/09/2020  4:32 PM EDT ----- Regarding: RE: MRI For elevated lipase ----- Message ----- From: Timothy Lasso, RN Sent: 02/09/2020   4:06 PM EDT To: Levin Erp, PA Subject: RE: MRI                                        Anderson Malta I do not see where the pt was asked to have a CT.  He had one in Feb. Please advise?  What is the MRI MRCP for? ----- Message ----- From: Levin Erp, PA Sent: 02/09/2020   3:54 PM EDT To: Timothy Lasso, RN Subject: MRI                                            Please order MRI with MRCP, not CT as previously discussed. THanks-JLL

## 2020-02-09 NOTE — Patient Instructions (Signed)
If you are age 57 or older, your body mass index should be between 23-30. Your Body mass index is 29.85 kg/m. If this is out of the aforementioned range listed, please consider follow up with your Primary Care Provider.  If you are age 47 or younger, your body mass index should be between 19-25. Your Body mass index is 29.85 kg/m. If this is out of the aformentioned range listed, please consider follow up with your Primary Care Provider.   Your provider has requested that you go to the basement level for lab work before leaving today. Press "B" on the elevator. The lab is located at the first door on the left as you exit the elevator.  Continue Dexilant 60 mg daily.  Follow up as needed.

## 2020-02-12 NOTE — Telephone Encounter (Signed)
The pt has been advised and will call with any concerns.    You have been scheduled for an MRI/MRCP at [facility] on 02/22/20. Your appointment time is 7 pm. Please arrive 30 minutes prior to your appointment time for registration purposes. Please make certain not to have anything to eat or drink 6 hours prior to your test. In addition, if you have any metal in your body, have a pacemaker or defibrillator, please be sure to let your ordering physician know. This test typically takes 45 minutes to 1 hour to complete. Should you need to reschedule, please call [phone] to do so.

## 2020-02-12 NOTE — Telephone Encounter (Signed)
Ellouise Newer [place], [place]  [place], [place] L, RN  It is for elevated lipase, epigastric pain, he already had a Ct and Dr. Ardis Hughs recommended different imaging modality.

## 2020-02-12 NOTE — Progress Notes (Signed)
I agree with the above note, plan.  His lipase was elevated and we are arranging repeat imaging with MR.

## 2020-02-22 ENCOUNTER — Ambulatory Visit (HOSPITAL_COMMUNITY): Payer: 59

## 2020-02-26 ENCOUNTER — Other Ambulatory Visit: Payer: Self-pay

## 2020-02-26 ENCOUNTER — Other Ambulatory Visit: Payer: Self-pay | Admitting: Physician Assistant

## 2020-02-26 ENCOUNTER — Ambulatory Visit (HOSPITAL_COMMUNITY)
Admission: RE | Admit: 2020-02-26 | Discharge: 2020-02-26 | Disposition: A | Discharge status: Home / Self Care | Payer: 59 | Source: Ambulatory Visit | Attending: Physician Assistant | Admitting: Physician Assistant

## 2020-02-26 DIAGNOSIS — R748 Abnormal levels of other serum enzymes: Secondary | ICD-10-CM

## 2020-02-26 DIAGNOSIS — R1013 Epigastric pain: Secondary | ICD-10-CM | POA: Diagnosis present

## 2020-02-26 IMAGING — MR MR ABDOMEN WO/W CM MRCP
17 of 18 series · 45 of 48 positions shown · IV contrast (gadavist)
Comparison: CT on [DATE]

CLINICAL DATA: Epigastric abdominal pain. Elevated lipase.

EXAM:
MRI ABDOMEN WITHOUT AND WITH CONTRAST (INCLUDING MRCP)
TECHNIQUE: Multiplanar multisequence MR imaging of the abdomen was performed
both before and after the administration of intravenous contrast.
Heavily T2-weighted images of the biliary and pancreatic ducts were
obtained, and three-dimensional MRCP images were rendered by post
processing.
CONTRAST:  10mL GADAVIST GADOBUTROL 1 MMOL/ML IV SOLN

[Series 5: T2 fat-sat · axial · 6.0mm · 1.25mm/px · z∈[-177,+75]mm · 2 of 36 slices shown]
[im 1/36]
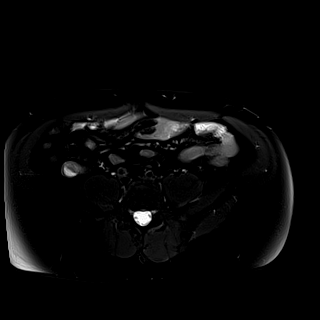
[im 36/36]
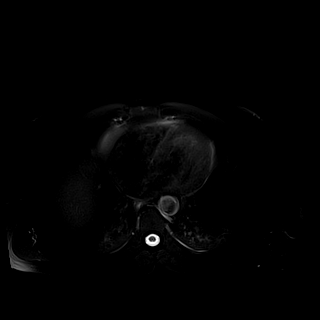

[Series 7: bSSFP · coronal · 6.0mm · 0.74mm/px · 2 of 37 slices shown]
[im 1/37]
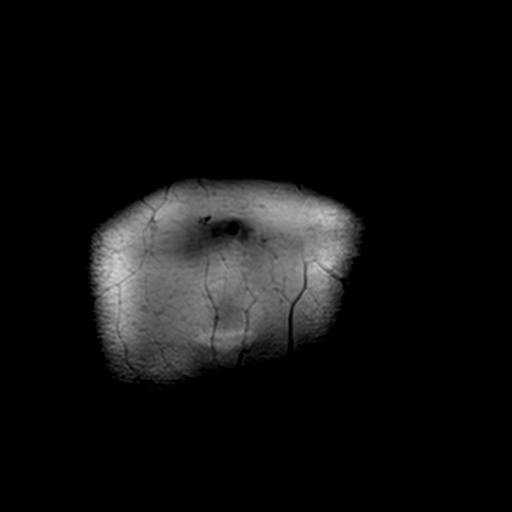
[im 37/37]
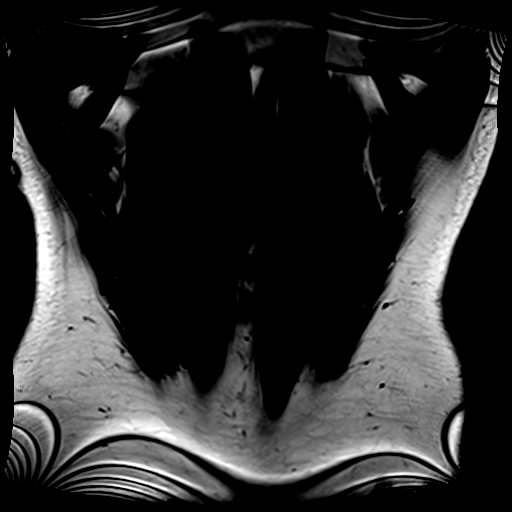

[Series 8: DWI · axial · 6.0mm · 1.49mm/px · z∈[-181,+71]mm · 4 of 72 slices shown (1 of 2)]
[im 1/72]
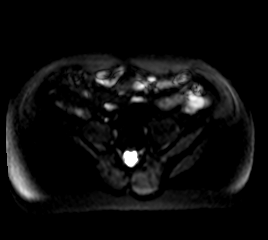
[im 24/72]
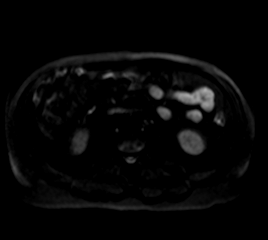
[im 48/72]
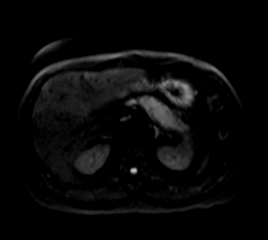
[im 72/72]
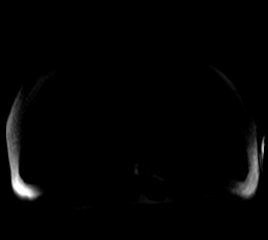

[Series 9: DWI · axial · 6.0mm · 1.49mm/px · z∈[-181,+71]mm · 2 of 36 slices shown (2 of 2)]
[im 1/36]
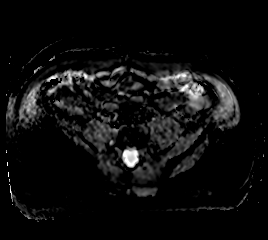
[im 36/36]
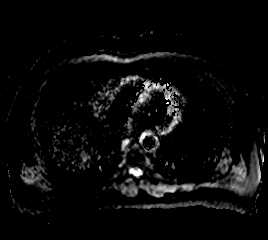

[Series 11: T1 · axial · 3.0mm · 1.25mm/px · z∈[-153,+108]mm · 3 of 88 slices shown]
[im 1/88]
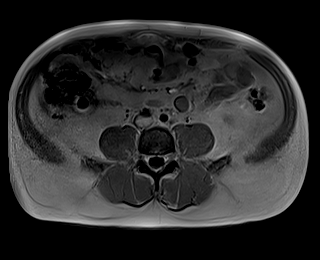
[im 44/88]
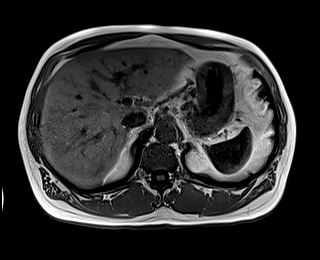
[im 88/88]
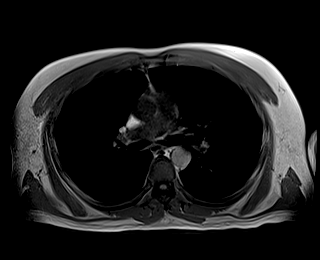

[Series 13: cor obl thk · sagittal · 50.0mm · 0.78mm/px · 1 of 9 slices shown]
[im 1/9]
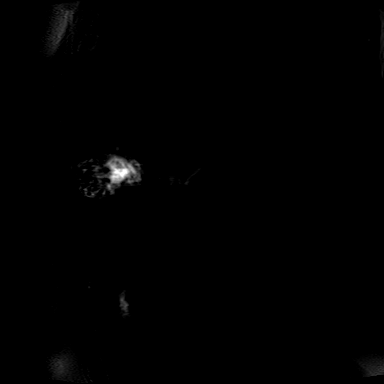

[Series 14: T2 · axial · 6.0mm · 1.56mm/px · 1 of 36 slices shown]
[im 1/36]
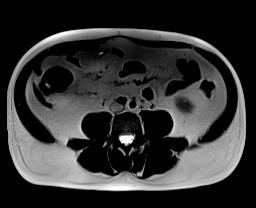

[Series 16: T1 dynamic · axial · 3.0mm · 1.25mm/px · z∈[-167,+94]mm · 3 of 88 slices shown (1 of 6)]
[im 1/88]
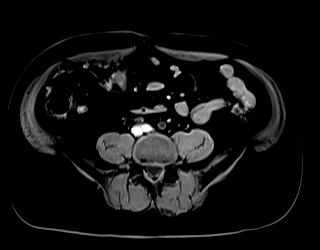
[im 44/88]
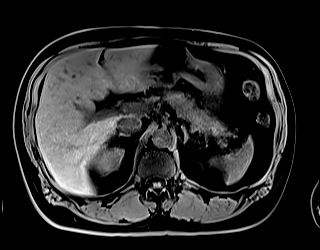
[im 88/88]
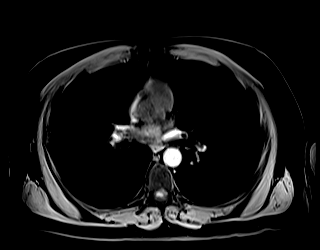

[Series 19: T1 dynamic · axial · 3.0mm · 1.25mm/px · z∈[-167,+94]mm · 3 of 88 slices shown (2 of 6)]
[im 1/88]
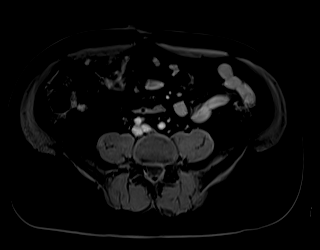
[im 44/88]
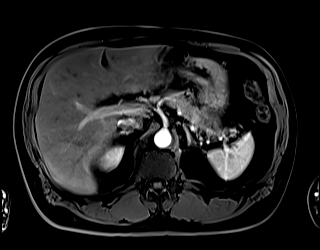
[im 88/88]
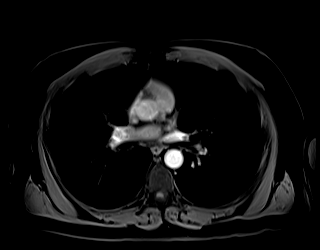

[Series 21: T1 dynamic · axial · 3.0mm · 1.25mm/px · z∈[-167,+94]mm · 3 of 88 slices shown (3 of 6)]
[im 1/88]
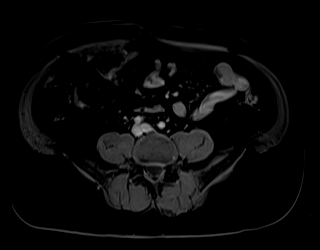
[im 44/88]
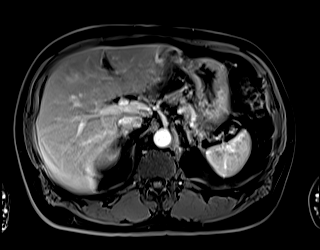
[im 88/88]
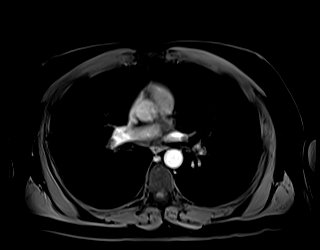

[Series 23: T1 dynamic · axial · 3.0mm · 1.25mm/px · z∈[-167,+94]mm · 3 of 88 slices shown (4 of 6)]
[im 1/88]
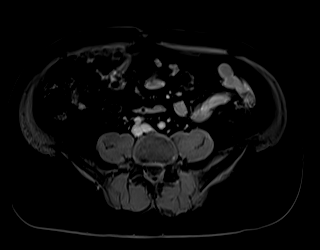
[im 44/88]
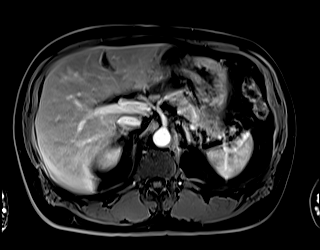
[im 88/88]
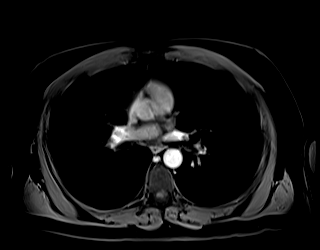

[Series 25: T1 dynamic · coronal · 3.0mm · 1.41mm/px · 3 of 72 slices shown (5 of 6)]
[im 1/72]
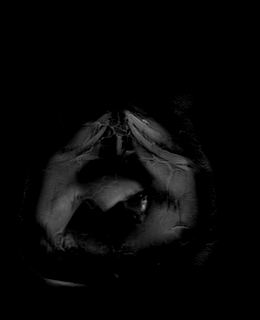
[im 36/72]
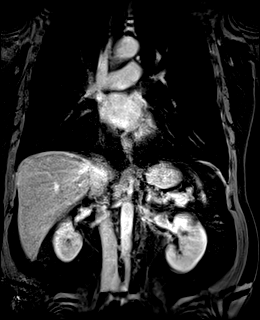
[im 72/72]
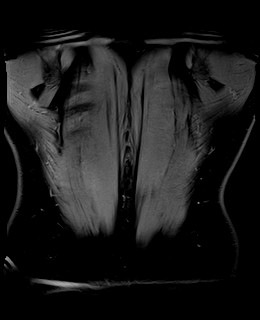

[Series 27: T1 dynamic · axial · 3.0mm · 1.25mm/px · z∈[-167,+94]mm · 3 of 88 slices shown (6 of 6)]
[im 1/88]
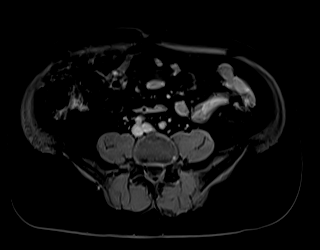
[im 44/88]
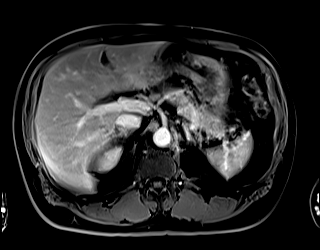
[im 88/88]
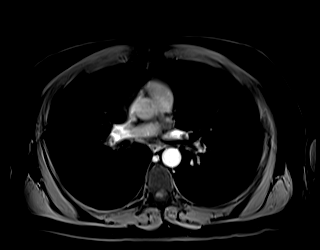

[Series 100: sub (id) · axial · 3.0mm · 1.25mm/px · z∈[-167,+94]mm · 3 of 88 slices shown]
[im 1/88]
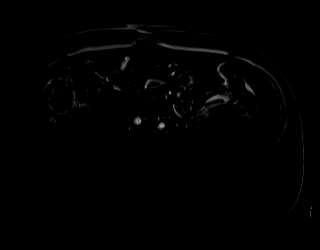
[im 44/88]
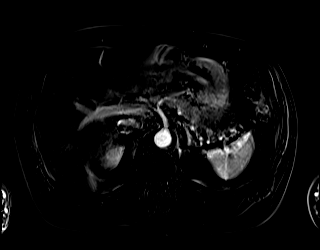
[im 88/88]
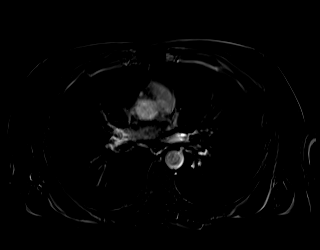

[Series 101: sub 45 sec · axial · 3.0mm · 1.25mm/px · z∈[-167,+94]mm · 3 of 88 slices shown]
[im 1/88]
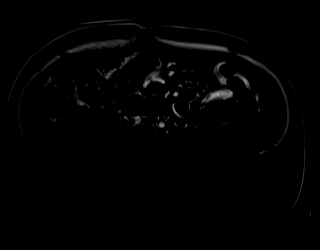
[im 44/88]
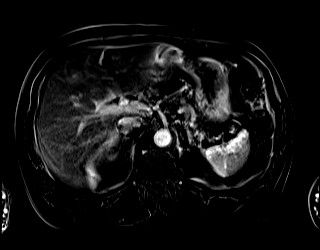
[im 88/88]
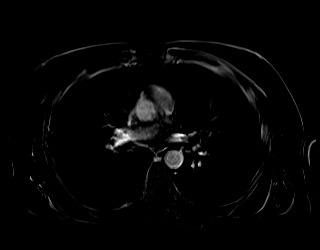

[Series 102: sub 90 sec · axial · 3.0mm · 1.25mm/px · z∈[-167,+94]mm · 3 of 88 slices shown]
[im 1/88]
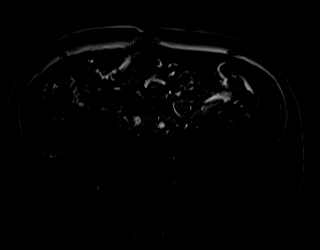
[im 44/88]
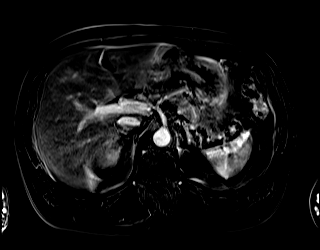
[im 88/88]
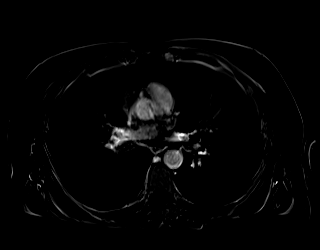

[Series 103: sub 3 min · axial · 3.0mm · 1.25mm/px · z∈[-167,+94]mm · 3 of 88 slices shown]
[im 1/88]
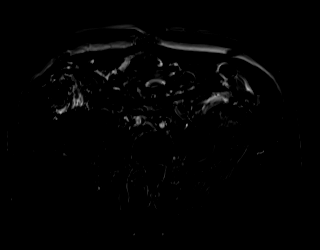
[im 44/88]
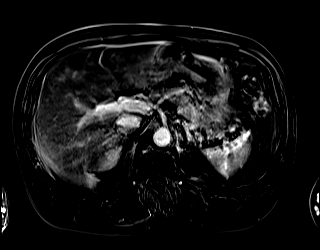
[im 88/88]
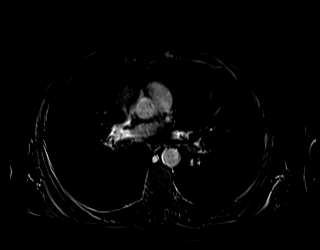

[45 of 48 positions shown; findings below may reference images not displayed]

FINDINGS: Lower chest: No acute findings.

Hepatobiliary: No hepatic masses identified. A few tiny scattered
sub-cm hepatic cysts are noted. Gallbladder is unremarkable. No
evidence of biliary ductal dilatation.

Pancreas: An ill-defined area of swelling and decreased parenchymal
enhancement is seen in the pancreatic body (e.g. Image 49/9). Mild
pancreatic ductal dilatation is noted in the pancreatic tail. These
features favor focal pancreatitis over neoplasm. No evidence of
pancreatic necrosis or pseudocysts.

Spleen:  Within normal limits in size and appearance.

Adrenals/Urinary Tract: No masses identified. No evidence of
hydronephrosis.

Stomach/Bowel: Visualized portion unremarkable.

Vascular/Lymphatic: No pathologically enlarged lymph nodes
identified. No abdominal aortic aneurysm.

Other:  None.

Musculoskeletal:  No suspicious bone lesions identified.
IMPRESSION: 1. Probable focal acute pancreatitis involving the pancreatic body,
with neoplasm considered less likely. Recommend continued imaging
follow-up to confirm resolution.
2. No evidence of pancreatic necrosis or pseudocysts.
3. No evidence of biliary ductal dilatation or choledocholithiasis.

## 2020-02-26 IMAGING — MR MR 3D RECON AT [facility]
17 of 18 series · 45 of 48 positions shown · IV contrast (gadavist)
Comparison: CT on [DATE]

CLINICAL DATA: Epigastric abdominal pain. Elevated lipase.

EXAM:
MRI ABDOMEN WITHOUT AND WITH CONTRAST (INCLUDING MRCP)
TECHNIQUE: Multiplanar multisequence MR imaging of the abdomen was performed
both before and after the administration of intravenous contrast.
Heavily T2-weighted images of the biliary and pancreatic ducts were
obtained, and three-dimensional MRCP images were rendered by post
processing.
CONTRAST:  10mL GADAVIST GADOBUTROL 1 MMOL/ML IV SOLN

[Series 5: T2 fat-sat · axial · 6.0mm · 1.25mm/px · z∈[-177,+75]mm · 2 of 36 slices shown]
[im 1/36]
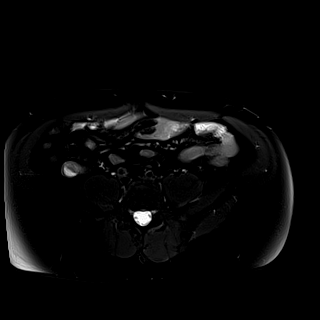
[im 36/36]
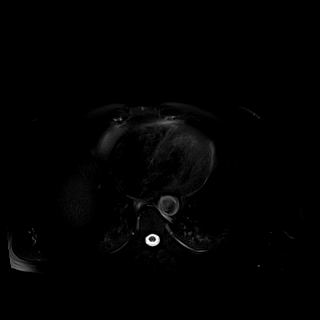

[Series 7: bSSFP · coronal · 6.0mm · 0.74mm/px · 2 of 37 slices shown]
[im 1/37]
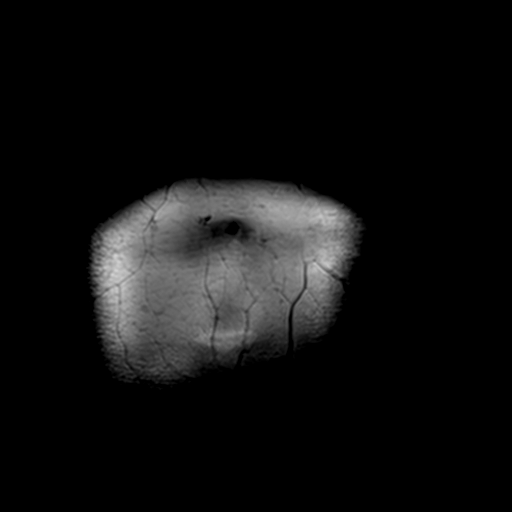
[im 37/37]
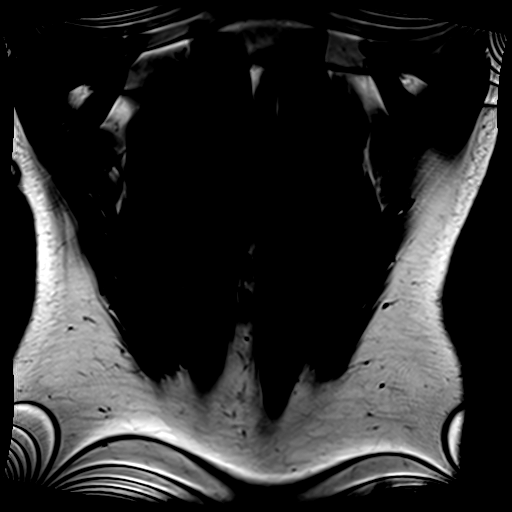

[Series 8: DWI · axial · 6.0mm · 1.49mm/px · z∈[-181,+71]mm · 4 of 72 slices shown (1 of 2)]
[im 1/72]
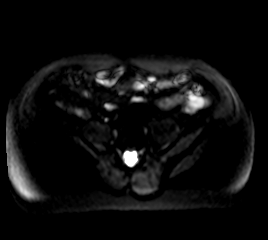
[im 24/72]
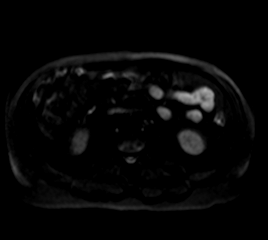
[im 48/72]
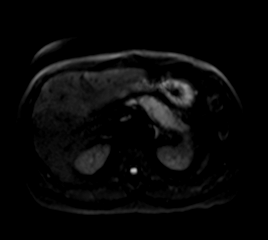
[im 72/72]
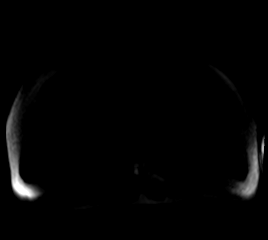

[Series 9: DWI · axial · 6.0mm · 1.49mm/px · z∈[-181,+71]mm · 2 of 36 slices shown (2 of 2)]
[im 1/36]
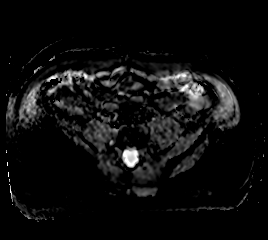
[im 36/36]
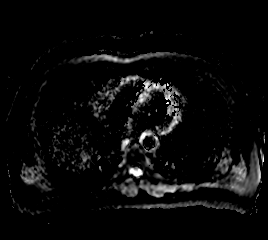

[Series 11: T1 · axial · 3.0mm · 1.25mm/px · z∈[-153,+108]mm · 3 of 88 slices shown]
[im 1/88]
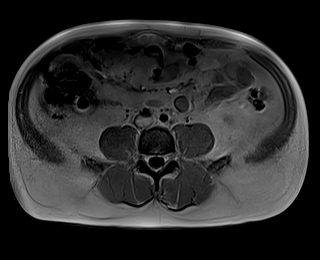
[im 44/88]
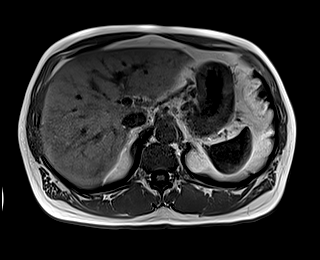
[im 88/88]
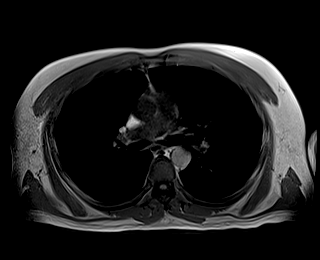

[Series 13: cor obl thk · sagittal · 50.0mm · 0.78mm/px · 1 of 9 slices shown]
[im 1/9]
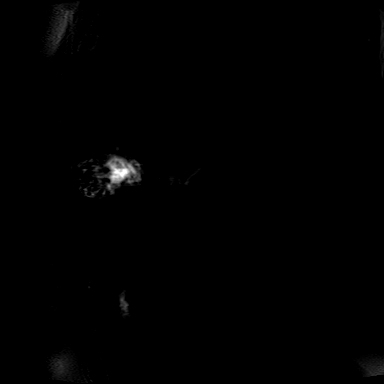

[Series 14: T2 · axial · 6.0mm · 1.56mm/px · 1 of 36 slices shown]
[im 1/36]
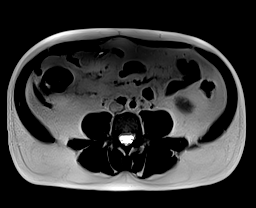

[Series 16: T1 dynamic · axial · 3.0mm · 1.25mm/px · z∈[-167,+94]mm · 3 of 88 slices shown (1 of 6)]
[im 1/88]
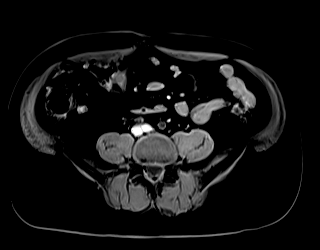
[im 44/88]
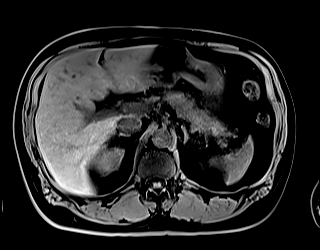
[im 88/88]
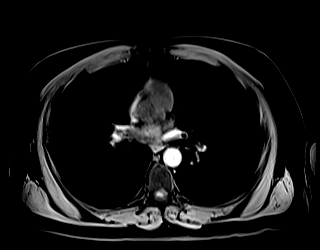

[Series 19: T1 dynamic · axial · 3.0mm · 1.25mm/px · z∈[-167,+94]mm · 3 of 88 slices shown (2 of 6)]
[im 1/88]
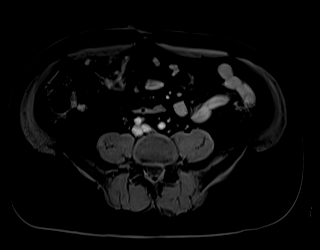
[im 44/88]
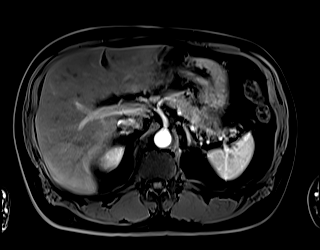
[im 88/88]
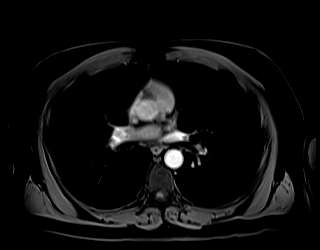

[Series 21: T1 dynamic · axial · 3.0mm · 1.25mm/px · z∈[-167,+94]mm · 3 of 88 slices shown (3 of 6)]
[im 1/88]
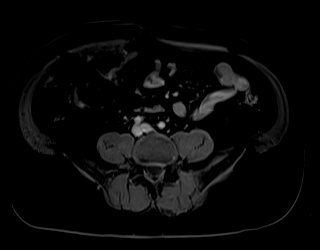
[im 44/88]
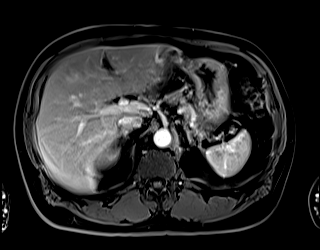
[im 88/88]
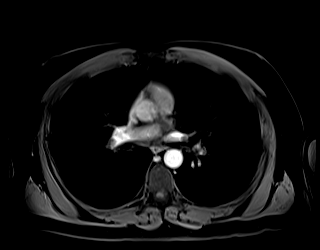

[Series 23: T1 dynamic · axial · 3.0mm · 1.25mm/px · z∈[-167,+94]mm · 3 of 88 slices shown (4 of 6)]
[im 1/88]
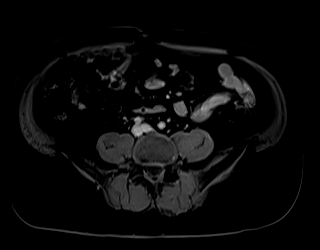
[im 44/88]
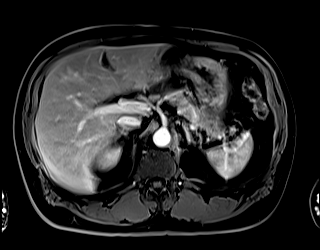
[im 88/88]
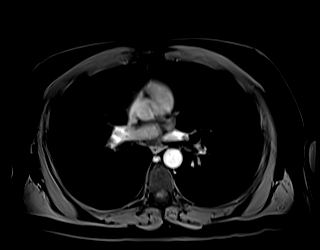

[Series 25: T1 dynamic · coronal · 3.0mm · 1.41mm/px · 3 of 72 slices shown (5 of 6)]
[im 1/72]
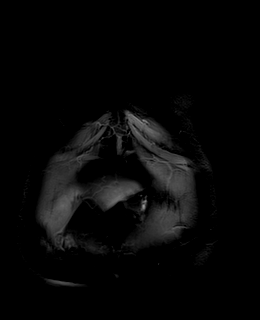
[im 36/72]
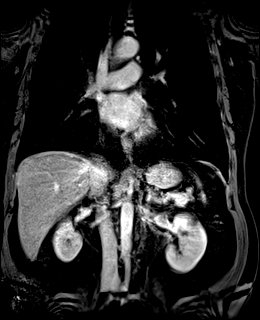
[im 72/72]
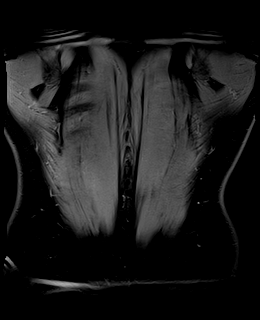

[Series 27: T1 dynamic · axial · 3.0mm · 1.25mm/px · z∈[-167,+94]mm · 3 of 88 slices shown (6 of 6)]
[im 1/88]
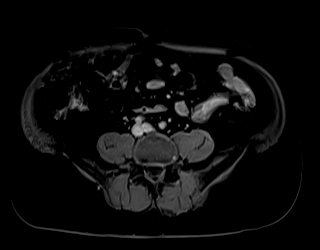
[im 44/88]
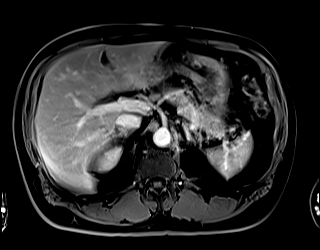
[im 88/88]
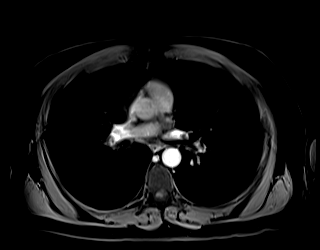

[Series 100: sub (id) · axial · 3.0mm · 1.25mm/px · z∈[-167,+94]mm · 3 of 88 slices shown]
[im 1/88]
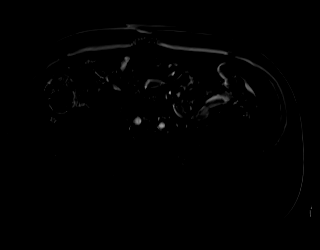
[im 44/88]
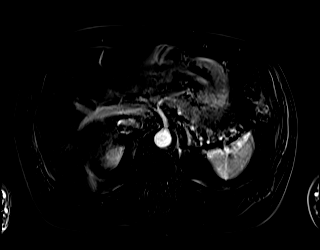
[im 88/88]
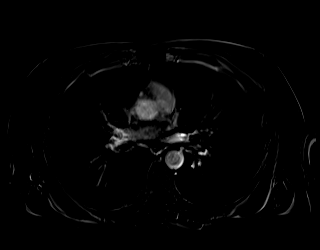

[Series 101: sub 45 sec · axial · 3.0mm · 1.25mm/px · z∈[-167,+94]mm · 3 of 88 slices shown]
[im 1/88]
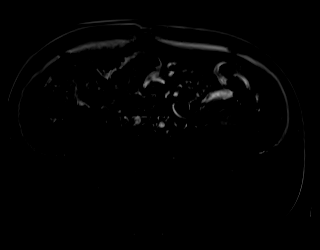
[im 44/88]
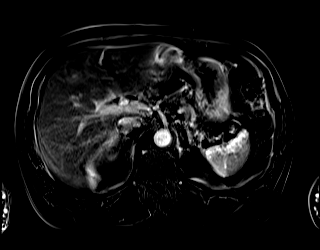
[im 88/88]
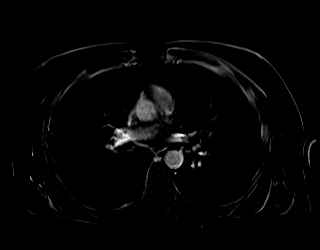

[Series 102: sub 90 sec · axial · 3.0mm · 1.25mm/px · z∈[-167,+94]mm · 3 of 88 slices shown]
[im 1/88]
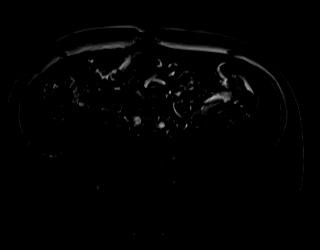
[im 44/88]
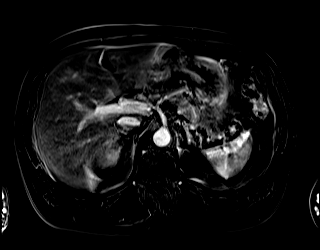
[im 88/88]
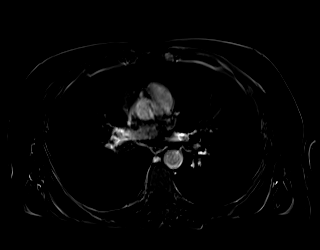

[Series 103: sub 3 min · axial · 3.0mm · 1.25mm/px · z∈[-167,+94]mm · 3 of 88 slices shown]
[im 1/88]
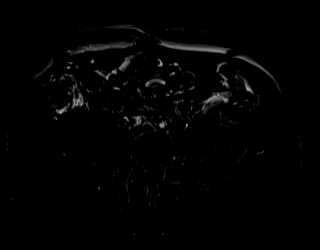
[im 44/88]
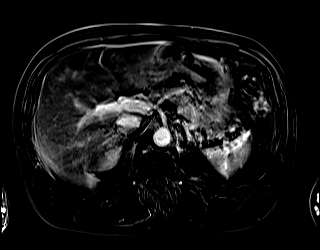
[im 88/88]
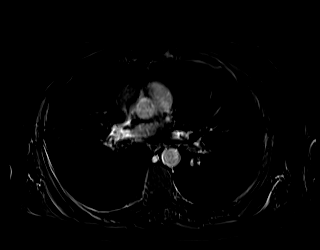

[45 of 48 positions shown; findings below may reference images not displayed]

FINDINGS: Lower chest: No acute findings.

Hepatobiliary: No hepatic masses identified. A few tiny scattered
sub-cm hepatic cysts are noted. Gallbladder is unremarkable. No
evidence of biliary ductal dilatation.

Pancreas: An ill-defined area of swelling and decreased parenchymal
enhancement is seen in the pancreatic body (e.g. Image 49/9). Mild
pancreatic ductal dilatation is noted in the pancreatic tail. These
features favor focal pancreatitis over neoplasm. No evidence of
pancreatic necrosis or pseudocysts.

Spleen:  Within normal limits in size and appearance.

Adrenals/Urinary Tract: No masses identified. No evidence of
hydronephrosis.

Stomach/Bowel: Visualized portion unremarkable.

Vascular/Lymphatic: No pathologically enlarged lymph nodes
identified. No abdominal aortic aneurysm.

Other:  None.

Musculoskeletal:  No suspicious bone lesions identified.
IMPRESSION: 1. Probable focal acute pancreatitis involving the pancreatic body,
with neoplasm considered less likely. Recommend continued imaging
follow-up to confirm resolution.
2. No evidence of pancreatic necrosis or pseudocysts.
3. No evidence of biliary ductal dilatation or choledocholithiasis.

## 2020-02-26 MED ORDER — GADOBUTROL 1 MMOL/ML IV SOLN
10.0000 mL | Freq: Once | INTRAVENOUS | Status: AC | PRN
  Administered 2020-02-26: 09:00:00 10 mL via INTRAVENOUS

## 2020-02-27 ENCOUNTER — Other Ambulatory Visit: Payer: Self-pay

## 2020-02-27 DIAGNOSIS — R948 Abnormal results of function studies of other organs and systems: Secondary | ICD-10-CM

## 2020-04-01 ENCOUNTER — Other Ambulatory Visit: Payer: Self-pay | Admitting: Gastroenterology

## 2020-04-01 ENCOUNTER — Inpatient Hospital Stay (HOSPITAL_COMMUNITY): Admission: RE | Admit: 2020-04-01 | Payer: 59 | Source: Ambulatory Visit

## 2020-04-02 LAB — SARS CORONAVIRUS 2 (TAT 6-24 HRS): SARS Coronavirus 2: NEGATIVE

## 2020-04-03 NOTE — Progress Notes (Signed)
Pre call done.  Patient to arrive at 0630.

## 2020-04-04 ENCOUNTER — Other Ambulatory Visit: Payer: Self-pay

## 2020-04-04 ENCOUNTER — Encounter (HOSPITAL_COMMUNITY)
Admission: RE | Disposition: A | Discharge status: Home / Self Care | Payer: Self-pay | Source: Home / Self Care | Attending: Gastroenterology

## 2020-04-04 ENCOUNTER — Encounter (HOSPITAL_COMMUNITY): Payer: Self-pay | Admitting: Gastroenterology

## 2020-04-04 ENCOUNTER — Ambulatory Visit (HOSPITAL_COMMUNITY)
Admission: RE | Admit: 2020-04-04 | Discharge: 2020-04-04 | Disposition: A | Discharge status: Home / Self Care | Payer: 59 | Attending: Gastroenterology | Admitting: Gastroenterology

## 2020-04-04 ENCOUNTER — Ambulatory Visit (HOSPITAL_COMMUNITY): Payer: 59 | Admitting: Certified Registered Nurse Anesthetist

## 2020-04-04 DIAGNOSIS — R634 Abnormal weight loss: Secondary | ICD-10-CM | POA: Insufficient documentation

## 2020-04-04 DIAGNOSIS — E119 Type 2 diabetes mellitus without complications: Secondary | ICD-10-CM | POA: Diagnosis not present

## 2020-04-04 DIAGNOSIS — F17210 Nicotine dependence, cigarettes, uncomplicated: Secondary | ICD-10-CM | POA: Diagnosis not present

## 2020-04-04 DIAGNOSIS — R109 Unspecified abdominal pain: Secondary | ICD-10-CM | POA: Insufficient documentation

## 2020-04-04 DIAGNOSIS — K8689 Other specified diseases of pancreas: Secondary | ICD-10-CM | POA: Diagnosis not present

## 2020-04-04 DIAGNOSIS — Q453 Other congenital malformations of pancreas and pancreatic duct: Secondary | ICD-10-CM

## 2020-04-04 DIAGNOSIS — Z6831 Body mass index (BMI) 31.0-31.9, adult: Secondary | ICD-10-CM | POA: Diagnosis not present

## 2020-04-04 DIAGNOSIS — R933 Abnormal findings on diagnostic imaging of other parts of digestive tract: Secondary | ICD-10-CM | POA: Diagnosis present

## 2020-04-04 HISTORY — PX: EUS: SHX5427

## 2020-04-04 HISTORY — PX: ESOPHAGOGASTRODUODENOSCOPY (EGD) WITH PROPOFOL: SHX5813

## 2020-04-04 HISTORY — PX: FINE NEEDLE ASPIRATION: SHX5430

## 2020-04-04 LAB — GLUCOSE, CAPILLARY: Glucose-Capillary: 139 mg/dL — ABNORMAL HIGH (ref 70–99)

## 2020-04-04 SURGERY — UPPER ENDOSCOPIC ULTRASOUND (EUS) RADIAL
Anesthesia: Monitor Anesthesia Care

## 2020-04-04 MED ORDER — PROPOFOL 10 MG/ML IV BOLUS
INTRAVENOUS | Status: AC
  Filled 2020-04-04: qty 20

## 2020-04-04 MED ORDER — LIDOCAINE 2% (20 MG/ML) 5 ML SYRINGE
INTRAMUSCULAR | Status: DC | PRN
  Administered 2020-04-04: 100 mg via INTRAVENOUS

## 2020-04-04 MED ORDER — LACTATED RINGERS IV SOLN
INTRAVENOUS | Status: DC | PRN
Start: 2020-04-04 — End: 2020-04-04

## 2020-04-04 MED ORDER — PROPOFOL 500 MG/50ML IV EMUL
INTRAVENOUS | Status: AC
  Filled 2020-04-04: qty 50

## 2020-04-04 MED ORDER — ONDANSETRON HCL 4 MG/2ML IJ SOLN
INTRAMUSCULAR | Status: DC | PRN
  Administered 2020-04-04: 4 mg via INTRAVENOUS

## 2020-04-04 MED ORDER — FENTANYL CITRATE (PF) 100 MCG/2ML IJ SOLN
INTRAMUSCULAR | Status: AC
  Filled 2020-04-04: qty 2

## 2020-04-04 MED ORDER — FENTANYL CITRATE (PF) 100 MCG/2ML IJ SOLN
INTRAMUSCULAR | Status: DC | PRN
  Administered 2020-04-04 (×3): 25 ug via INTRAVENOUS

## 2020-04-04 MED ORDER — GLYCOPYRROLATE 0.2 MG/ML IJ SOLN
INTRAMUSCULAR | Status: DC | PRN
  Administered 2020-04-04: .2 mg via INTRAVENOUS

## 2020-04-04 MED ORDER — PROPOFOL 500 MG/50ML IV EMUL
INTRAVENOUS | Status: DC | PRN
  Administered 2020-04-04: 125 ug/kg/min via INTRAVENOUS

## 2020-04-04 MED ORDER — SUCCINYLCHOLINE CHLORIDE 20 MG/ML IJ SOLN
INTRAMUSCULAR | Status: DC | PRN
Start: 2020-04-04 — End: 2020-04-04
  Administered 2020-04-04: 160 mg via INTRAVENOUS

## 2020-04-04 MED ORDER — PROPOFOL 10 MG/ML IV BOLUS
INTRAVENOUS | Status: DC | PRN
  Administered 2020-04-04: 30 mg via INTRAVENOUS
  Administered 2020-04-04: 200 mg via INTRAVENOUS
  Administered 2020-04-04: 10 mg via INTRAVENOUS
  Administered 2020-04-04: 20 mg via INTRAVENOUS

## 2020-04-04 NOTE — Anesthesia Procedure Notes (Signed)
Procedure Name: Intubation Date/Time: 04/04/2020 7:59 AM Performed by: West Pugh, CRNA Pre-anesthesia Checklist: Patient identified, Emergency Drugs available, Suction available, Patient being monitored and Timeout performed Patient Re-evaluated:Patient Re-evaluated prior to induction Oxygen Delivery Method: Circle system utilized Preoxygenation: Pre-oxygenation with 100% oxygen Induction Type: IV induction, Rapid sequence and Cricoid Pressure applied Laryngoscope Size: Mac and 4 Grade View: Grade II Tube type: Oral Tube size: 7.0 mm Number of attempts: 1 Airway Equipment and Method: Stylet Placement Confirmation: ETT inserted through vocal cords under direct vision,  positive ETCO2,  CO2 detector and breath sounds checked- equal and bilateral Secured at: 22 cm Tube secured with: Tape Dental Injury: Teeth and Oropharynx as per pre-operative assessment  Comments: AOI.

## 2020-04-04 NOTE — Op Note (Signed)
[facility] Patient Name: Darrell Wyatt Procedure Date: 04/04/2020 MRN: [id number] Attending MD: [name] , MD Date of Birth: 11/04/1960 CSN: [id number] Age: 59 Admit Type: Outpatient Procedure:                Upper EUS Indications:              Abdominal pain, weight loss, new diabetes. CT scan                            abd/pelvis 11/2019 showed normal pancreas.                            Colonoscopy/EGD showed no clear etiology. MRI                            02/2020 reported "Probable focal acute pancreatitis                            involving the pancreatic body, with neoplasm                            considered less likely. Recommend continued imaging                            follow-up to confirm resolution". Weight loss and                            abd pains have improved since DM under control now. Providers:                [name], MD, Benetta Spar RN, RN, Corie Chiquito, Technician, Christell Faith, CRNA Referring MD:              Medicines:                MAC to General Anesthesia after coughing due to                            very copious secretions Complications:            No immediate complications. Estimated blood loss:                            None. Estimated Blood Loss:     Estimated blood loss: none. Procedure:                Pre-Anesthesia Assessment:                           - Prior to the procedure, a History and Physical                            was performed, and patient medications and  allergies were reviewed. The patient's tolerance of                            previous anesthesia was also reviewed. The risks                            and benefits of the procedure and the sedation                            options and risks were discussed with the patient.                            All questions were answered, and informed consent                             was obtained. Prior Anticoagulants: The patient has                            taken no previous anticoagulant or antiplatelet                            agents. ASA Grade Assessment: II - A patient with                            mild systemic disease. After reviewing the risks                            and benefits, the patient was deemed in                            satisfactory condition to undergo the procedure.                           After obtaining informed consent, the endoscope was                            passed under direct vision. Throughout the                            procedure, the patient's blood pressure, pulse, and                            oxygen saturations were monitored continuously. The                            was introduced through the mouth, and advanced to                            the second part of duodenum. The upper EUS was                            accomplished without difficulty. The patient  tolerated the procedure well. Scope In: Scope Out: Findings:      ENDOSONOGRAPHIC FINDING: :      1. There were a few hyperechoic strands and foci throughout the       pancreas. The overall anatomy of the pancreas was somewhat abnormal,       with subtle 'partitioning' of the pancreas along vascular planes of       splenic artery, vein (anatomic variant?). The main pancreatic duct was       normal throughout it's course in the uncinate, head, and body however it       was dilated to 3.20m in the tail. There was not an obvious mass at the       site of ductal caliber change however given the presenting symptoms I       sampled the pancreatic parenchyma at the site with two transgastric       passes with a 22 gauge EUS FNA needle. Final cytology results are       pending.      2. CBD was normal, non-dilated.      3. No peripancreatic adenopathy.      4. Gallbladder was normal.      5. Limited views of the liver, spleen, portal  vessels were all normal. Impression:               - The overall anatomy of the pancreas was somewhat                            abnormal, with subtle 'partitioning' of the                            pancreas along vascular planes of splenic artery,                            vein (anatomic variant?). The main pancreatic duct                            was normal throughout it's course in the uncinate,                            head, and body however it was dilated to 3.569min                            the tail. There was not an obvious mass at the site                            of ductal caliber change however given the                            presenting symptoms I sampled the pancreatic                            parenchyma at the site with two transgastric passes                            with a 22 gauge EUS FNA needle. Final cytology  results are pending.                           - A few hyperechoic strands and foci throughout the                            pancreas. Moderate Sedation:      Not Applicable - Patient had care per Anesthesia. Recommendation:           - Discharge patient to home (ambulatory).                           - Await final cytology.                           - Will consider labs (IgG 4) and surveillance                            imaging (with MR) in 2-3 months pending the final                            cytology results. Procedure Code(s):        --- Professional ---                           [phone], Esophagogastroduodenoscopy, flexible,                            transoral; with transendoscopic ultrasound-guided                            intramural or transmural fine needle                            aspiration/biopsy(s), (includes endoscopic                            ultrasound examination limited to the esophagus,                            stomach or duodenum, and adjacent structures) Diagnosis Code(s):        ---  Professional ---                           K86.89, Other specified diseases of pancreas                           R93.3, Abnormal findings on diagnostic imaging of                            other parts of digestive tract CPT copyright 2019 [employer]. All rights reserved. The codes documented in this report are preliminary and upon coder review may  be revised to meet current compliance requirements. [name], MD 04/04/2020 8:42:29 AM This report has been signed electronically. Number of Addenda: 0

## 2020-04-04 NOTE — Discharge Instructions (Signed)
YOU HAD AN ENDOSCOPIC PROCEDURE TODAY: Refer to the procedure report and other information in the discharge instructions given to you for any specific questions about what was found during the examination. If this information does not answer your questions, please call [place] office at [phone] to clarify.  ° °YOU SHOULD EXPECT: Some feelings of bloating in the abdomen. Passage of more gas than usual. Walking can help get rid of the air that was put into your GI tract during the procedure and reduce the bloating. If you had a lower endoscopy (such as a colonoscopy or flexible sigmoidoscopy) you may notice spotting of blood in your stool or on the toilet paper. Some abdominal soreness may be present for a day or two, also. ° °DIET: Your first meal following the procedure should be a light meal and then it is ok to progress to your normal diet. A half-sandwich or bowl of soup is an example of a good first meal. Heavy or fried foods are harder to digest and may make you feel nauseous or bloated. Drink plenty of fluids but you should avoid alcoholic beverages for 24 hours. If you had a esophageal dilation, please see attached instructions for diet.   ° °ACTIVITY: Your care partner should take you home directly after the procedure. You should plan to take it easy, moving slowly for the rest of the day. You can resume normal activity the day after the procedure however YOU SHOULD NOT DRIVE, use power tools, machinery or perform tasks that involve climbing or major physical exertion for 24 hours (because of the sedation medicines used during the test).  ° °SYMPTOMS TO REPORT IMMEDIATELY: °A gastroenterologist can be reached at any hour. Please call [phone]  for any of the following symptoms:  °Following lower endoscopy (colonoscopy, flexible sigmoidoscopy) °Excessive amounts of blood in the stool  °Significant tenderness, worsening of abdominal pains  °Swelling of the abdomen that is new, acute  °Fever of 100° or  higher  °Following upper endoscopy (EGD, EUS, ERCP, esophageal dilation) °Vomiting of blood or coffee ground material  °New, significant abdominal pain  °New, significant chest pain or pain under the shoulder blades  °Painful or persistently difficult swallowing  °New shortness of breath  °Black, tarry-looking or red, bloody stools ° °FOLLOW UP:  °If any biopsies were taken you will be contacted by phone or by letter within the next 1-3 weeks. Call [phone]  if you have not heard about the biopsies in 3 weeks.  °Please also call with any specific questions about appointments or follow up tests. ° °

## 2020-04-04 NOTE — H&P (Signed)
HPI: This is a 59 yo man with ewight loss, new diabetes, abd pain. CT 11/2019 showed normal pancreas. MR last month showed abnormal pancreas.  ROS: complete GI ROS as described in HPI, all other review negative.  Constitutional:  No unintentional weight loss   Past Medical History:  Diagnosis Date  . Chronic gastritis   . Diverticulosis of colon    left side  . Hyperlipidemia   . Tubular adenoma of colon 12/2019    Past Surgical History:  Procedure Laterality Date  . COLONOSCOPY N/A 09/06/2014   Procedure: COLONOSCOPY;  Surgeon: [name], MD;  Location: [facility];  Service: Endoscopy;  Laterality: N/A;  . ESOPHAGOGASTRODUODENOSCOPY    . KNEE ARTHROSCOPY      No current facility-administered medications for this encounter.    Allergies as of 02/27/2020  . (No Known Allergies)    Family History  Problem Relation Age of Onset  . Diverticulitis Mother   . Diverticulitis Sister   . Colon cancer Neg Hx   . Esophageal cancer Neg Hx   . Rectal cancer Neg Hx   . Stomach cancer Neg Hx     Social History   Socioeconomic History  . Marital status: Married    Spouse name: Not on file  . Number of children: 5  . Years of education: Not on file  . Highest education level: Not on file  Occupational History  . Occupation: self employed  Tobacco Use  . Smoking status: Current Every Day Smoker    Packs/day: 0.50    Types: Cigarettes  . Smokeless tobacco: Never Used  Vaping Use  . Vaping Use: Never used  Substance and Sexual Activity  . Alcohol use: Yes    Comment: very rarely  . Drug use: No  . Sexual activity: Not on file  Other Topics Concern  . Not on file  Social History Narrative  . Not on file   Social Determinants of Health   Financial Resource Strain:   . Difficulty of Paying Living Expenses:   Food Insecurity:   . Worried About Charity fundraiser in the Last Year:   . Arboriculturist in the Last Year:   Transportation Needs:   . Lexicographer (Medical):   Marland Kitchen Lack of Transportation (Non-Medical):   Physical Activity:   . Days of Exercise per Week:   . Minutes of Exercise per Session:   Stress:   . Feeling of Stress :   Social Connections:   . Frequency of Communication with Friends and Family:   . Frequency of Social Gatherings with Friends and Family:   . Attends Religious Services:   . Active Member of Clubs or Organizations:   . Attends Archivist Meetings:   Marland Kitchen Marital Status:   Intimate Partner Violence:   . Fear of Current or Ex-Partner:   . Emotionally Abused:   Marland Kitchen Physically Abused:   . Sexually Abused:      Physical Exam: BP (!) 141/87   Pulse 67   Temp 98.5 F (36.9 C) (Oral)   Resp 14   Ht 5\' 11"  (1.803 m)   Wt 92.5 kg   SpO2 98%   BMI 28.45 kg/m  Constitutional: generally well-appearing Psychiatric: alert and oriented x3 Abdomen: soft, nontender, nondistended, no obvious ascites, no peritoneal signs, normal bowel sounds No peripheral edema noted in lower extremities  Assessment and plan: 59 y.o. male with abnormal pancreas on MR, recent weight loss and new  DM  For upper EUS today  Please see the "Patient Instructions" section for addition details about the plan.  Owens Loffler, MD [facility] 04/04/2020, 7:18 AM

## 2020-04-04 NOTE — Transfer of Care (Signed)
Immediate Anesthesia Transfer of Care Note  Patient: Darrell Wyatt  Procedure(s) Performed: UPPER ENDOSCOPIC ULTRASOUND (EUS) RADIAL (N/A ) FINE NEEDLE ASPIRATION (FNA) LINEAR (N/A )  Patient Location: PACU  Anesthesia Type:General  Level of Consciousness: awake, alert , oriented and patient cooperative  Airway & Oxygen Therapy: Patient Spontanous Breathing and Patient connected to face mask oxygen  Post-op Assessment: Report given to RN and Post -op Vital signs reviewed and stable  Post vital signs: Reviewed and stable  Last Vitals:  Vitals Value Taken Time  BP 116/75 04/04/20 0850  Temp 36.6 C 04/04/20 0850  Pulse 59 04/04/20 0851  Resp 26 04/04/20 0851  SpO2 100 % 04/04/20 0851  Vitals shown include unvalidated device data.  Last Pain:  Vitals:   04/04/20 0850  TempSrc: Oral  PainSc: 0-No pain         Complications: No complications documented.

## 2020-04-04 NOTE — Anesthesia Procedure Notes (Addendum)
Procedure Name: MAC Date/Time: 04/04/2020 7:35 AM Performed by: West Pugh, CRNA Pre-anesthesia Checklist: Patient identified, Emergency Drugs available, Suction available, Patient being monitored and Timeout performed Patient Re-evaluated:Patient Re-evaluated prior to induction Oxygen Delivery Method: Simple face mask Preoxygenation: Pre-oxygenation with 100% oxygen Placement Confirmation: positive ETCO2 Dental Injury: Teeth and Oropharynx as per pre-operative assessment

## 2020-04-04 NOTE — Anesthesia Preprocedure Evaluation (Signed)
Anesthesia Evaluation  Patient identified by MRN, date of birth, ID band Patient awake    Airway Mallampati: I       Dental  (+) Loose   Pulmonary neg pulmonary ROS, Current Smoker and Patient abstained from smoking.,    Pulmonary exam normal breath sounds clear to auscultation       Cardiovascular negative cardio ROS Normal cardiovascular exam Rhythm:Regular Rate:Normal     Neuro/Psych negative neurological ROS  negative psych ROS   GI/Hepatic negative GI ROS, Neg liver ROS,   Endo/Other  negative endocrine ROS  Renal/GU negative Renal ROS  negative genitourinary   Musculoskeletal negative musculoskeletal ROS (+)   Abdominal Normal abdominal exam  (+)   Peds  Hematology negative hematology ROS (+)   Anesthesia Other Findings   Reproductive/Obstetrics                             Anesthesia Physical Anesthesia Plan  ASA: II  Anesthesia Plan: MAC   Post-op Pain Management:    Induction:   PONV Risk Score and Plan:   Airway Management Planned: Mask and Natural Airway  Additional Equipment: None  Intra-op Plan:   Post-operative Plan:   Informed Consent: I have reviewed the patients History and Physical, chart, labs and discussed the procedure including the risks, benefits and alternatives for the proposed anesthesia with the patient or authorized representative who has indicated his/her understanding and acceptance.     Dental advisory given  Plan Discussed with: CRNA  Anesthesia Plan Comments:         Anesthesia Quick Evaluation

## 2020-04-05 ENCOUNTER — Emergency Department (HOSPITAL_COMMUNITY)
Admission: EM | Admit: 2020-04-05 | Discharge: 2020-04-05 | Disposition: A | Discharge status: Home / Self Care | Payer: 59 | Source: Home / Self Care | Attending: Emergency Medicine | Admitting: Emergency Medicine

## 2020-04-05 ENCOUNTER — Other Ambulatory Visit: Payer: Self-pay

## 2020-04-05 ENCOUNTER — Telehealth: Payer: Self-pay | Admitting: Gastroenterology

## 2020-04-05 ENCOUNTER — Encounter (HOSPITAL_COMMUNITY): Payer: Self-pay | Admitting: *Deleted

## 2020-04-05 DIAGNOSIS — K859 Acute pancreatitis without necrosis or infection, unspecified: Secondary | ICD-10-CM

## 2020-04-05 DIAGNOSIS — Z794 Long term (current) use of insulin: Secondary | ICD-10-CM | POA: Insufficient documentation

## 2020-04-05 DIAGNOSIS — F17210 Nicotine dependence, cigarettes, uncomplicated: Secondary | ICD-10-CM | POA: Insufficient documentation

## 2020-04-05 DIAGNOSIS — R1010 Upper abdominal pain, unspecified: Secondary | ICD-10-CM | POA: Diagnosis not present

## 2020-04-05 DIAGNOSIS — M545 Low back pain: Secondary | ICD-10-CM | POA: Insufficient documentation

## 2020-04-05 DIAGNOSIS — E119 Type 2 diabetes mellitus without complications: Secondary | ICD-10-CM | POA: Insufficient documentation

## 2020-04-05 DIAGNOSIS — R10816 Epigastric abdominal tenderness: Secondary | ICD-10-CM | POA: Insufficient documentation

## 2020-04-05 DIAGNOSIS — U071 COVID-19: Secondary | ICD-10-CM | POA: Diagnosis not present

## 2020-04-05 LAB — COMPREHENSIVE METABOLIC PANEL
ALT: 17 U/L (ref 0–44)
AST: 17 U/L (ref 15–41)
Albumin: 3.9 g/dL (ref 3.5–5.0)
Alkaline Phosphatase: 54 U/L (ref 38–126)
Anion gap: 10 (ref 5–15)
BUN: 14 mg/dL (ref 6–20)
CO2: 22 mmol/L (ref 22–32)
Calcium: 8.8 mg/dL — ABNORMAL LOW (ref 8.9–10.3)
Chloride: 108 mmol/L (ref 98–111)
Creatinine, Ser: 0.91 mg/dL (ref 0.61–1.24)
GFR calc Af Amer: >60 mL/min (ref >60)
GFR calc non Af Amer: >60 mL/min (ref >60)
Glucose, Bld: 173 mg/dL — ABNORMAL HIGH (ref 70–99)
Potassium: 3.6 mmol/L (ref 3.5–5.1)
Sodium: 140 mmol/L (ref 135–145)
Total Bilirubin: 0.6 mg/dL (ref 0.3–1.2)
Total Protein: 7.3 g/dL (ref 6.5–8.1)

## 2020-04-05 LAB — URINALYSIS, ROUTINE W REFLEX MICROSCOPIC
Bilirubin Urine: NEGATIVE
Glucose, UA: NEGATIVE mg/dL
Hgb urine dipstick: NEGATIVE
Ketones, ur: NEGATIVE mg/dL
Leukocytes,Ua: NEGATIVE
Nitrite: NEGATIVE
Protein, ur: NEGATIVE mg/dL
Specific Gravity, Urine: 1.029 (ref 1.005–1.030)
pH: 5 (ref 5.0–8.0)

## 2020-04-05 LAB — CBC
HCT: 41.8 % (ref 39.0–52.0)
Hemoglobin: 13.5 g/dL (ref 13.0–17.0)
MCH: 28.3 pg (ref 26.0–34.0)
MCHC: 32.3 g/dL (ref 30.0–36.0)
MCV: 87.6 fL (ref 80.0–100.0)
Platelets: 337 10*3/uL (ref 150–400)
RBC: 4.77 MIL/uL (ref 4.22–5.81)
RDW: 15 % (ref 11.5–15.5)
WBC: 9.5 10*3/uL (ref 4.0–10.5)
nRBC: 0 % (ref 0.0–0.2)

## 2020-04-05 LAB — CYTOLOGY - NON PAP

## 2020-04-05 LAB — LIPASE, BLOOD: Lipase: 188 U/L — ABNORMAL HIGH (ref 11–51)

## 2020-04-05 MED ORDER — ONDANSETRON HCL 4 MG PO TABS
4.0000 mg | ORAL_TABLET | Freq: Three times a day (TID) | ORAL | 0 refills | Status: DC | PRN
Start: 2020-04-05 — End: 2020-08-15

## 2020-04-05 MED ORDER — OXYCODONE-ACETAMINOPHEN 5-325 MG PO TABS: 1.0000 | ORAL_TABLET | Freq: Four times a day (QID) | ORAL | 0 refills | Status: DC | PRN

## 2020-04-05 MED ORDER — SODIUM CHLORIDE 0.9% FLUSH: 3.0000 mL | Freq: Once | INTRAVENOUS | Status: DC

## 2020-04-05 NOTE — Discharge Instructions (Signed)
You were seen in the emergency department for evaluation of abdominal pain after eating.  This is likely related to your endoscopy you had today and the biopsy they did of your pancreas.  It was recommended that you be admitted to the hospital for management of your symptoms.  Unfortunately you are not able to stay in the hospital due to work obligations.  Please do a clear liquid diet.  We are providing you a short prescription of pain and nausea medication.  Return to the emergency department if any worsening or concerning symptoms.

## 2020-04-05 NOTE — Telephone Encounter (Signed)
The pt has been advised to head to the ED now.  He has agreed and will go now.

## 2020-04-05 NOTE — ED Triage Notes (Signed)
Pt had a pancreatic biopsy yesterday, abd pain especially after eating. No vomiting

## 2020-04-05 NOTE — Telephone Encounter (Signed)
The pt states that after his EUS yesterday he developed abd pain in the center of his abdomen around the belly button.  He says it is a 10/10 for several hours after eating and the pain resolves.  He states he is eating very light; broth and juice. He has no fever, nausea or other GI symptoms. Please advise

## 2020-04-05 NOTE — Telephone Encounter (Signed)
He needs to go to the ER for evaluation, may have post FNA pancreatitis.

## 2020-04-05 NOTE — ED Provider Notes (Signed)
[facility] DEPT Provider Note   CSN: [id number] Arrival date & time: 04/05/20  1625     History Chief Complaint  Patient presents with  . Abdominal Pain    Darrell Wyatt is a 59 y.o. male.  He has no significant past medical history but has been noticing weight loss.  Diabetes abdominal pain.  He followed up with lower GI and had an abnormal MRI.  Yesterday he underwent in endoscopic ultrasound with FNA of pancreas.  He was discharged home last evening and experienced abdominal pain after eating.  This recurred again multiple times today after eating.  He called the GI clinic and they recommended he come to the emergency department.  Denies any fevers chills nausea vomiting.  He said his pain is 10 out of 10 after he eats right now is currently moderate in intensity.  Its upper abdomen and radiates through to the back.  The history is provided by the patient.  Abdominal Pain Pain location:  Epigastric Pain quality: aching   Pain radiates to:  Back Pain severity:  Severe Onset quality:  Gradual Duration:  24 hours Timing:  Intermittent Progression:  Unchanged Chronicity:  New Context: not alcohol use, not sick contacts and not suspicious food intake   Relieved by:  Nothing Worsened by:  Eating Ineffective treatments:  None tried Associated symptoms: no chest pain, no constipation, no cough, no diarrhea, no dysuria, no fever, no hematemesis, no hematochezia, no hematuria, no nausea, no shortness of breath, no sore throat and no vomiting   Risk factors: recent hospitalization   Risk factors: no alcohol abuse        Past Medical History:  Diagnosis Date  . Chronic gastritis   . Diverticulosis of colon    left side  . Hyperlipidemia   . Tubular adenoma of colon 12/2019    Patient Active Problem List   Diagnosis Date Noted  . Pancreatic abnormality   . Change in bowel habits 08/28/2014  . Heme positive stool 08/28/2014  . Rectal  bleeding 08/28/2014  . LLQ abdominal pain 08/28/2014    Past Surgical History:  Procedure Laterality Date  . COLONOSCOPY N/A 09/06/2014   Procedure: COLONOSCOPY;  Surgeon: [name], MD;  Location: [facility];  Service: Endoscopy;  Laterality: N/A;  . ESOPHAGOGASTRODUODENOSCOPY    . ESOPHAGOGASTRODUODENOSCOPY (EGD) WITH PROPOFOL N/A 04/04/2020   Procedure: ESOPHAGOGASTRODUODENOSCOPY (EGD) WITH PROPOFOL;  Surgeon: [name], MD;  Location: [facility];  Service: Endoscopy;  Laterality: N/A;  . EUS N/A 04/04/2020   Procedure: UPPER ENDOSCOPIC ULTRASOUND (EUS) RADIAL;  Surgeon: [name], MD;  Location: [facility];  Service: Endoscopy;  Laterality: N/A;  . FINE NEEDLE ASPIRATION N/A 04/04/2020   Procedure: FINE NEEDLE ASPIRATION (FNA) LINEAR;  Surgeon: [name], MD;  Location: [facility];  Service: Endoscopy;  Laterality: N/A;  . KNEE ARTHROSCOPY         Family History  Problem Relation Age of Onset  . Diverticulitis Mother   . Diverticulitis Sister   . Colon cancer Neg Hx   . Esophageal cancer Neg Hx   . Rectal cancer Neg Hx   . Stomach cancer Neg Hx     Social History   Tobacco Use  . Smoking status: Current Every Day Smoker    Packs/day: 0.50    Types: Cigarettes  . Smokeless tobacco: Never Used  Vaping Use  . Vaping Use: Never used  Substance Use Topics  . Alcohol use: Yes  Comment: very rarely  . Drug use: No    Home Medications Prior to Admission medications   Medication Sig Start Date End Date Taking? Authorizing Provider  DEXILANT 60 MG capsule Take 60 mg by mouth daily.  01/18/20   [provider]  Empagliflozin-metFORMIN HCl ER (SYNJARDY XR) 25-1000 MG TB24 Take 1 tablet by mouth daily.    [provider]  insulin aspart (FIASP FLEXTOUCH) 100 UNIT/ML FlexTouch Pen Inject 10 Units into the skin daily as needed (blood glucose over 200).    [provider]  insulin glargine, 2 Unit Dial, (TOUJEO MAX  SOLOSTAR) 300 UNIT/ML Solostar Pen Inject 20 Units into the skin daily.    [provider]  sulfamethoxazole-trimethoprim (BACTRIM DS) 800-160 MG tablet Take 1 tablet by mouth daily.    [provider]    Allergies    Patient has no known allergies.  Review of Systems   Review of Systems  Constitutional: Negative for fever.  HENT: Negative for sore throat.   Eyes: Negative for visual disturbance.  Respiratory: Negative for cough and shortness of breath.   Cardiovascular: Negative for chest pain.  Gastrointestinal: Positive for abdominal pain. Negative for constipation, diarrhea, hematemesis, hematochezia, nausea and vomiting.  Genitourinary: Negative for dysuria and hematuria.  Musculoskeletal: Positive for back pain.  Skin: Negative for rash.  Neurological: Negative for headaches.    Physical Exam Updated Vital Signs BP 137/85 (BP Location: Right Arm)   Pulse 73   Temp 98.1 F (36.7 C) (Oral)   Resp 16   Ht 5\' 11"  (1.803 m)   Wt 95.3 kg   SpO2 97%   BMI 29.29 kg/m   Physical Exam Vitals and nursing note reviewed.  Constitutional:      Appearance: Normal appearance. He is well-developed.  HENT:     Head: Normocephalic and atraumatic.  Eyes:     Conjunctiva/sclera: Conjunctivae normal.  Cardiovascular:     Rate and Rhythm: Normal rate and regular rhythm.     Heart sounds: No murmur heard.   Pulmonary:     Effort: Pulmonary effort is normal. No respiratory distress.     Breath sounds: Normal breath sounds.  Abdominal:     Palpations: Abdomen is soft.     Tenderness: There is abdominal tenderness in the epigastric area. There is no guarding or rebound.  Musculoskeletal:        General: No deformity or signs of injury. Normal range of motion.     Cervical back: Neck supple.  Skin:    General: Skin is warm and dry.     Capillary Refill: Capillary refill takes less than 2 seconds.  Neurological:     General: No focal deficit present.     Mental  Status: He is alert.     Sensory: No sensory deficit.     Motor: No weakness.     Gait: Gait normal.     ED Results / Procedures / Treatments   Labs (all labs ordered are listed, but only abnormal results are displayed) Labs Reviewed  LIPASE, BLOOD - Abnormal; Notable for the following components:      Result Value   Lipase 188 (*)    All other components within normal limits  COMPREHENSIVE METABOLIC PANEL - Abnormal; Notable for the following components:   Glucose, Bld 173 (*)    Calcium 8.8 (*)    All other components within normal limits  CBC  URINALYSIS, ROUTINE W REFLEX MICROSCOPIC    EKG None  Radiology No results found.  Procedures Procedures (including critical care time)  Medications Ordered in ED Medications - No data to display  ED Course  I have reviewed the triage vital signs and the nursing notes.  Pertinent labs & imaging results that were available during my care of the patient were reviewed by me and considered in my medical decision making (see chart for details).  Clinical Course as of Apr 06 1048  Fri Apr 05, 2020  2153 Patient here with likely postprocedural pancreatitis.  I reviewed his physical exam findings vitals and lab results with Dr. Fonnie Mu GI.  She recommends that he be observation in the hospital n.p.o. and IV fluids.  I reviewed this with the patient.  He says he has an important job that he has to take care of tomorrow and would rather be discharged.  He understands he could have worsening of his condition.  He seems to appreciate this in his understanding.  We will provide him a short course of some pain medicine and nausea medication.  He understands to call of our GI if he has any worsening of his condition or to return to the emergency department for likely admission.   [MB]    Clinical Course User Index [MB] Hayden Rasmussen, MD   MDM Rules/Calculators/A&P                         This patient complains of upper abdominal  pain; this involves an extensive number of treatment Options and is a complaint that carries with it a high risk of complications and Morbidity. The differential includes pancreatitis, gastritis, obstruction, perforation  I ordered, reviewed and interpreted labs, which included CBC with normal white count normal hemoglobin.  Chemistries and LFTs normal other than elevation of glucose 170.  Urinalysis clean.  Lipase elevated 188.  Possibly related to his procedure yesterday. Previous records obtained and reviewed in epic including Dr. Ardis Hughs EUS report I consulted Dr. Fonnie Mu GI and discussed lab and imaging findings  Critical Interventions: None  After the interventions stated above, I reevaluated the patient and found patient's pain to be minimal at the time.  He was offered admission to the hospital for further management of his symptoms.  He states he has a significant job obligation tomorrow and is unable to state.  We will provide him with prescription for some pain medicine and nausea medication.  Return instructions discussed.   Final Clinical Impression(s) / ED Diagnoses Final diagnoses:  Acute pancreatitis, unspecified complication status, unspecified pancreatitis type    Rx / DC Orders ED Discharge Orders         Ordered    oxyCODONE-acetaminophen (PERCOCET/ROXICET) 5-325 MG tablet  Every 6 hours PRN     Discontinue  Reprint     04/05/20 2156    ondansetron (ZOFRAN) 4 MG tablet  Every 8 hours PRN     Discontinue  Reprint     04/05/20 2156           Hayden Rasmussen, MD 04/06/20 1052

## 2020-04-08 ENCOUNTER — Inpatient Hospital Stay (HOSPITAL_COMMUNITY)
Admission: EM | Admit: 2020-04-08 | Discharge: 2020-04-10 | DRG: 177 | Disposition: A | Discharge status: Home / Self Care | Payer: 59 | Attending: Family Medicine | Admitting: Family Medicine

## 2020-04-08 ENCOUNTER — Inpatient Hospital Stay (HOSPITAL_COMMUNITY): Payer: 59

## 2020-04-08 ENCOUNTER — Other Ambulatory Visit: Payer: Self-pay

## 2020-04-08 ENCOUNTER — Encounter (HOSPITAL_COMMUNITY): Payer: Self-pay

## 2020-04-08 DIAGNOSIS — Z8719 Personal history of other diseases of the digestive system: Secondary | ICD-10-CM | POA: Diagnosis not present

## 2020-04-08 DIAGNOSIS — Z794 Long term (current) use of insulin: Secondary | ICD-10-CM

## 2020-04-08 DIAGNOSIS — K2970 Gastritis, unspecified, without bleeding: Secondary | ICD-10-CM | POA: Diagnosis present

## 2020-04-08 DIAGNOSIS — U071 COVID-19: Secondary | ICD-10-CM | POA: Diagnosis present

## 2020-04-08 DIAGNOSIS — R1010 Upper abdominal pain, unspecified: Secondary | ICD-10-CM | POA: Diagnosis present

## 2020-04-08 DIAGNOSIS — F17210 Nicotine dependence, cigarettes, uncomplicated: Secondary | ICD-10-CM | POA: Diagnosis present

## 2020-04-08 DIAGNOSIS — K859 Acute pancreatitis without necrosis or infection, unspecified: Secondary | ICD-10-CM | POA: Diagnosis present

## 2020-04-08 DIAGNOSIS — E785 Hyperlipidemia, unspecified: Secondary | ICD-10-CM | POA: Diagnosis present

## 2020-04-08 DIAGNOSIS — Z79899 Other long term (current) drug therapy: Secondary | ICD-10-CM | POA: Diagnosis not present

## 2020-04-08 DIAGNOSIS — E1169 Type 2 diabetes mellitus with other specified complication: Secondary | ICD-10-CM | POA: Diagnosis not present

## 2020-04-08 DIAGNOSIS — E119 Type 2 diabetes mellitus without complications: Secondary | ICD-10-CM | POA: Diagnosis present

## 2020-04-08 LAB — URINALYSIS, ROUTINE W REFLEX MICROSCOPIC
Bilirubin Urine: NEGATIVE
Glucose, UA: NEGATIVE mg/dL
Ketones, ur: 5 mg/dL — AB
Leukocytes,Ua: NEGATIVE
Nitrite: NEGATIVE
Protein, ur: NEGATIVE mg/dL
Specific Gravity, Urine: 1.025 (ref 1.005–1.030)
pH: 6 (ref 5.0–8.0)

## 2020-04-08 LAB — CBC
HCT: 41.9 % (ref 39.0–52.0)
HCT: 38.8 % — ABNORMAL LOW (ref 39.0–52.0)
Hemoglobin: 13.4 g/dL (ref 13.0–17.0)
Hemoglobin: 12.6 g/dL — ABNORMAL LOW (ref 13.0–17.0)
MCH: 27.5 pg (ref 26.0–34.0)
MCH: 28.1 pg (ref 26.0–34.0)
MCHC: 32 g/dL (ref 30.0–36.0)
MCHC: 32.5 g/dL (ref 30.0–36.0)
MCV: 85.9 fL (ref 80.0–100.0)
MCV: 86.4 fL (ref 80.0–100.0)
Platelets: 313 10*3/uL (ref 150–400)
Platelets: 317 10*3/uL (ref 150–400)
RBC: 4.88 MIL/uL (ref 4.22–5.81)
RBC: 4.49 MIL/uL (ref 4.22–5.81)
RDW: 14.8 % (ref 11.5–15.5)
RDW: 14.9 % (ref 11.5–15.5)
WBC: 8.9 10*3/uL (ref 4.0–10.5)
WBC: 9.5 10*3/uL (ref 4.0–10.5)
nRBC: 0 % (ref 0.0–0.2)
nRBC: 0 % (ref 0.0–0.2)

## 2020-04-08 LAB — COMPREHENSIVE METABOLIC PANEL
ALT: 14 U/L (ref 0–44)
AST: 16 U/L (ref 15–41)
Albumin: 3.7 g/dL (ref 3.5–5.0)
Alkaline Phosphatase: 51 U/L (ref 38–126)
Anion gap: 8 (ref 5–15)
BUN: 13 mg/dL (ref 6–20)
CO2: 26 mmol/L (ref 22–32)
Calcium: 8.7 mg/dL — ABNORMAL LOW (ref 8.9–10.3)
Chloride: 105 mmol/L (ref 98–111)
Creatinine, Ser: 0.89 mg/dL (ref 0.61–1.24)
GFR calc Af Amer: >60 mL/min (ref >60)
GFR calc non Af Amer: >60 mL/min (ref >60)
Glucose, Bld: 172 mg/dL — ABNORMAL HIGH (ref 70–99)
Potassium: 3.7 mmol/L (ref 3.5–5.1)
Sodium: 139 mmol/L (ref 135–145)
Total Bilirubin: 0.7 mg/dL (ref 0.3–1.2)
Total Protein: 7.6 g/dL (ref 6.5–8.1)

## 2020-04-08 LAB — SARS CORONAVIRUS 2 BY RT PCR (HOSPITAL ORDER, PERFORMED IN ~~LOC~~ HOSPITAL LAB): SARS Coronavirus 2: POSITIVE — AB

## 2020-04-08 LAB — LIPASE, BLOOD: Lipase: 106 U/L — ABNORMAL HIGH (ref 11–51)

## 2020-04-08 LAB — C-REACTIVE PROTEIN: CRP: 3.2 mg/dL — ABNORMAL HIGH (ref <1.0)

## 2020-04-08 LAB — CREATININE, SERUM
Creatinine, Ser: 0.78 mg/dL (ref 0.61–1.24)
GFR calc Af Amer: >60 mL/min (ref >60)
GFR calc non Af Amer: >60 mL/min (ref >60)

## 2020-04-08 LAB — D-DIMER, QUANTITATIVE: D-Dimer, Quant: 2.54 ug/mL-FEU — ABNORMAL HIGH (ref 0.00–0.50)

## 2020-04-08 IMAGING — DX DG CHEST 1V PORT
1 series · 1 of 1 positions shown · non-contrast
Comparison: None.

CLINICAL DATA: [AJ] positive.

EXAM:
PORTABLE CHEST 1 VIEW

[chest ap]
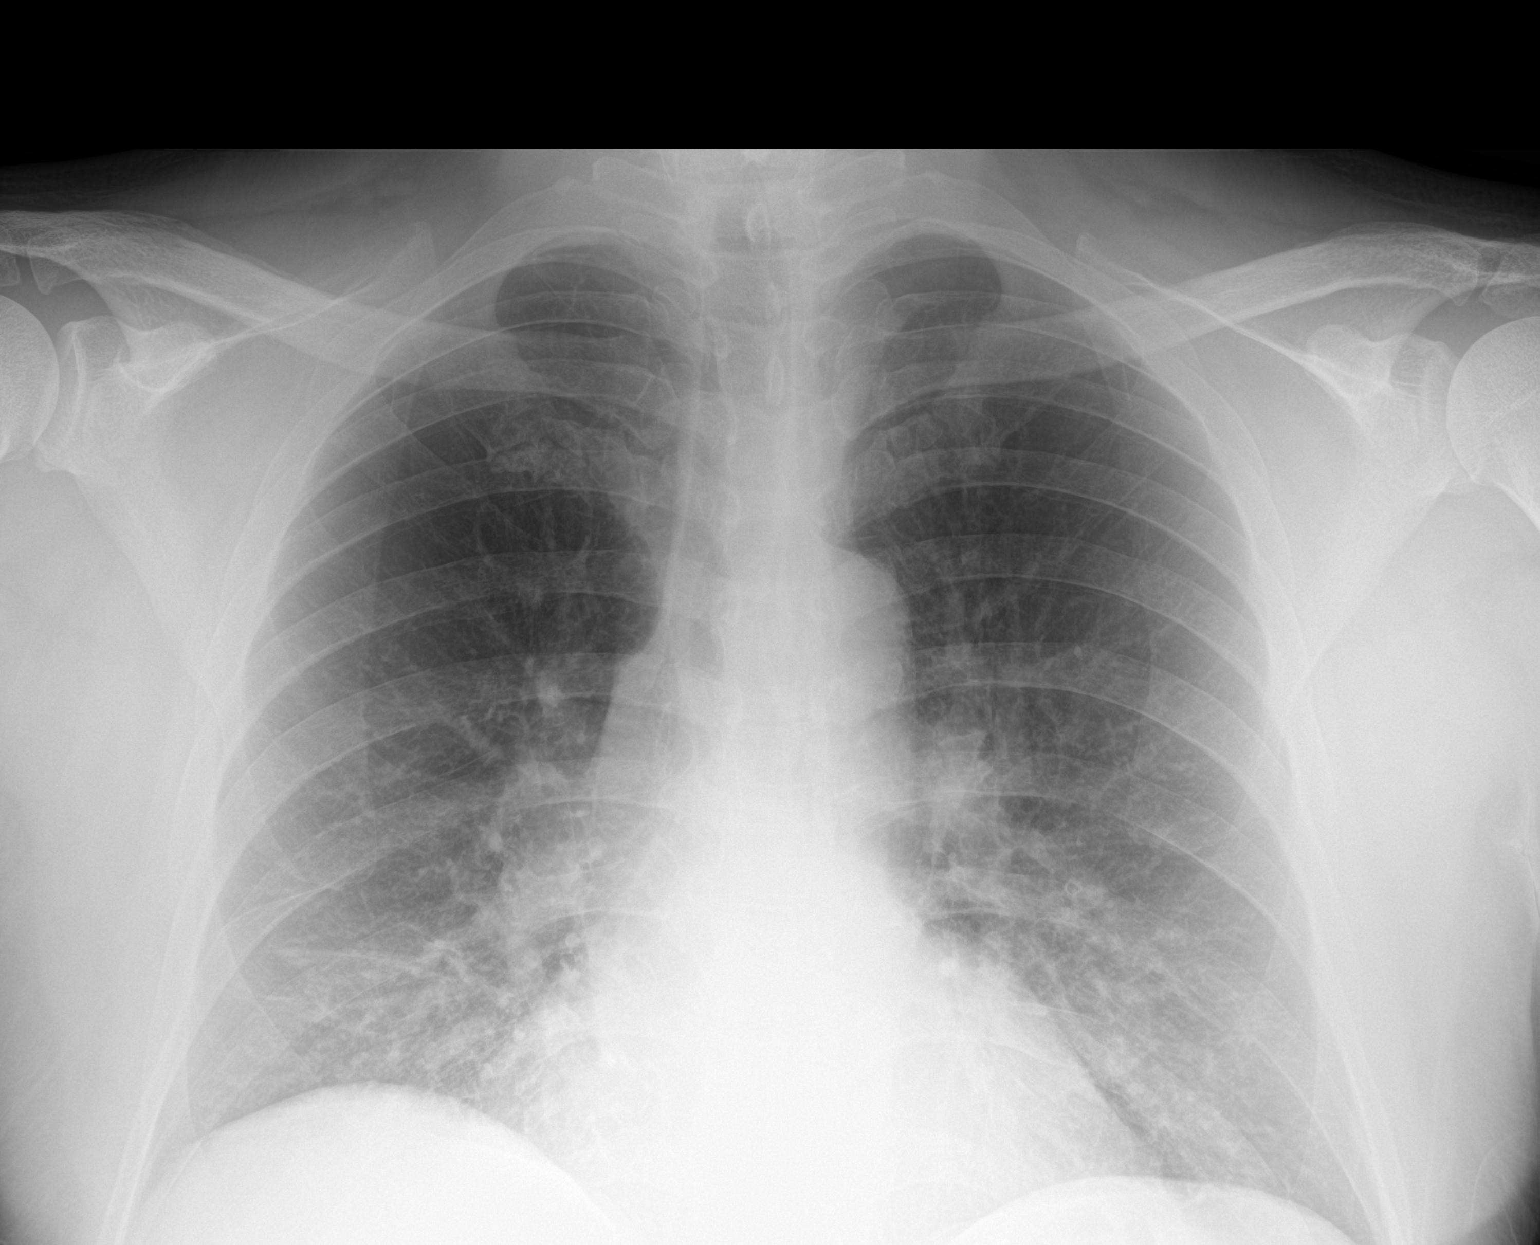

[1 of 1 positions shown; findings below may reference images not displayed]

FINDINGS: Patchy heterogeneous bilateral lung opacities in a basilar
predominant distribution. Heart is normal in size with normal
mediastinal contours. No pneumothorax or pleural effusion. No acute
osseous abnormalities are seen.
IMPRESSION: Patchy heterogeneous bilateral lung opacities in a basilar
predominant distribution, pattern consistent with [AJ]
pneumonia.

## 2020-04-08 MED ORDER — SODIUM CHLORIDE 0.9% FLUSH: 3.0000 mL | Freq: Once | INTRAVENOUS | Status: DC

## 2020-04-08 MED ORDER — MORPHINE SULFATE (PF) 2 MG/ML IV SOLN
2.0000 mg | INTRAVENOUS | Status: DC | PRN
  Filled 2020-04-08: qty 1

## 2020-04-08 MED ORDER — CIPROFLOXACIN IN D5W 400 MG/200ML IV SOLN
400.0000 mg | Freq: Once | INTRAVENOUS | Status: AC
  Administered 2020-04-08: 400 mg via INTRAVENOUS
  Filled 2020-04-08: qty 200

## 2020-04-08 MED ORDER — ONDANSETRON HCL 4 MG/2ML IJ SOLN
4.0000 mg | Freq: Four times a day (QID) | INTRAMUSCULAR | Status: DC | PRN
  Filled 2020-04-08: qty 2

## 2020-04-08 MED ORDER — ONDANSETRON HCL 4 MG PO TABS: 4.0000 mg | ORAL_TABLET | Freq: Four times a day (QID) | ORAL | Status: DC | PRN

## 2020-04-08 MED ORDER — LACTATED RINGERS IV SOLN: INTRAVENOUS | Status: DC

## 2020-04-08 MED ORDER — MORPHINE SULFATE (PF) 4 MG/ML IV SOLN
4.0000 mg | Freq: Once | INTRAVENOUS | Status: AC
  Administered 2020-04-08: 4 mg via INTRAVENOUS
  Filled 2020-04-08: qty 1

## 2020-04-08 MED ORDER — METRONIDAZOLE IN NACL 5-0.79 MG/ML-% IV SOLN
500.0000 mg | Freq: Once | INTRAVENOUS | Status: AC
  Administered 2020-04-08: 500 mg via INTRAVENOUS
  Filled 2020-04-08: qty 100

## 2020-04-08 MED ORDER — INSULIN ASPART 100 UNIT/ML ~~LOC~~ SOLN
0.0000 [IU] | Freq: Three times a day (TID) | SUBCUTANEOUS | Status: DC
  Administered 2020-04-09: 1 [IU] via SUBCUTANEOUS
  Filled 2020-04-08: qty 0.09

## 2020-04-08 MED ORDER — SODIUM CHLORIDE 0.9% FLUSH
3.0000 mL | Freq: Two times a day (BID) | INTRAVENOUS | Status: DC
  Administered 2020-04-08 – 2020-04-10 (×4): 3 mL via INTRAVENOUS

## 2020-04-08 MED ORDER — SODIUM CHLORIDE 0.9 % IV SOLN: 250.0000 mL | INTRAVENOUS | Status: DC | PRN

## 2020-04-08 MED ORDER — ACETAMINOPHEN 325 MG PO TABS: 650.0000 mg | ORAL_TABLET | Freq: Four times a day (QID) | ORAL | Status: DC | PRN

## 2020-04-08 MED ORDER — ENOXAPARIN SODIUM 40 MG/0.4ML ~~LOC~~ SOLN
40.0000 mg | SUBCUTANEOUS | Status: DC
  Administered 2020-04-08 – 2020-04-09 (×2): 40 mg via SUBCUTANEOUS
  Filled 2020-04-08 (×2): qty 0.4

## 2020-04-08 MED ORDER — PANTOPRAZOLE SODIUM 40 MG IV SOLR
40.0000 mg | Freq: Two times a day (BID) | INTRAVENOUS | Status: DC
  Administered 2020-04-08 – 2020-04-10 (×4): 40 mg via INTRAVENOUS
  Filled 2020-04-08 (×4): qty 40

## 2020-04-08 MED ORDER — ACETAMINOPHEN 650 MG RE SUPP: 650.0000 mg | Freq: Four times a day (QID) | RECTAL | Status: DC | PRN

## 2020-04-08 MED ORDER — OXYCODONE HCL 5 MG PO TABS
5.0000 mg | ORAL_TABLET | ORAL | Status: DC | PRN
  Administered 2020-04-09: 5 mg via ORAL
  Filled 2020-04-08: qty 1

## 2020-04-08 MED ORDER — SODIUM CHLORIDE 0.9 % IV BOLUS
1000.0000 mL | Freq: Once | INTRAVENOUS | Status: AC
  Administered 2020-04-08: 1000 mL via INTRAVENOUS

## 2020-04-08 MED ORDER — ONDANSETRON HCL 4 MG/2ML IJ SOLN
4.0000 mg | Freq: Once | INTRAMUSCULAR | Status: AC
  Administered 2020-04-08: 4 mg via INTRAVENOUS
  Filled 2020-04-08: qty 2

## 2020-04-08 MED ORDER — ONDANSETRON HCL 4 MG PO TABS: 4.0000 mg | ORAL_TABLET | Freq: Three times a day (TID) | ORAL | Status: DC | PRN

## 2020-04-08 MED ORDER — SODIUM CHLORIDE 0.9% FLUSH: 3.0000 mL | INTRAVENOUS | Status: DC | PRN

## 2020-04-08 NOTE — ED Triage Notes (Addendum)
Patient states he had a pancreas biopsy 4 days ago. Patient states he was seen 3 days ago in the ED and states that they wanted to admit him then,but refused. Patient states he was called by Dr. Eugenia Pancoast office today and was told the biopsy of his pancreas showed pancreatitis.

## 2020-04-08 NOTE — ED Provider Notes (Signed)
[facility] DEPT Provider Note   CSN: [id number] Arrival date & time: 04/08/20  1147     History Chief Complaint  Patient presents with  . Abnormal Lab    Darrell Wyatt is a 59 y.o. male.  He has a new history of diabetes and pancreatitis.  He underwent a biopsy 4 days ago by EUS and I actually saw him the day after when he had abdominal pain.  Lipase was elevated at that time and he was offered admission which she declined.  He went home with some pain medication and he said he is continued to have daily pain up to 10 out of 10 especially after eating upper and left-sided abdomen radiating through to his back.  No fevers chills.  Some nausea.  No vomiting or diarrhea.  He talked to GI today and they recommended he come to the hospital for admission.  The history is provided by the patient.  Abdominal Pain Pain location:  LUQ Pain quality: aching   Pain radiates to:  Back Pain severity:  Severe Onset quality:  Gradual Timing:  Intermittent Progression:  Unchanged Chronicity:  Recurrent Context: not alcohol use, not sick contacts and not suspicious food intake   Relieved by:  Nothing Worsened by:  Eating Ineffective treatments: narcotics. Associated symptoms: nausea   Associated symptoms: no chest pain, no cough, no diarrhea, no dysuria, no fever, no hematemesis, no hematochezia, no hematuria, no shortness of breath, no sore throat and no vomiting   Risk factors: recent hospitalization        Past Medical History:  Diagnosis Date  . Chronic gastritis   . Diverticulosis of colon    left side  . Hyperlipidemia   . Tubular adenoma of colon 12/2019    Patient Active Problem List   Diagnosis Date Noted  . Pancreatic abnormality   . Change in bowel habits 08/28/2014  . Heme positive stool 08/28/2014  . Rectal bleeding 08/28/2014  . LLQ abdominal pain 08/28/2014    Past Surgical History:  Procedure Laterality Date  . COLONOSCOPY N/A  09/06/2014   Procedure: COLONOSCOPY;  Surgeon: [name], MD;  Location: [facility];  Service: Endoscopy;  Laterality: N/A;  . ESOPHAGOGASTRODUODENOSCOPY    . ESOPHAGOGASTRODUODENOSCOPY (EGD) WITH PROPOFOL N/A 04/04/2020   Procedure: ESOPHAGOGASTRODUODENOSCOPY (EGD) WITH PROPOFOL;  Surgeon: [name], MD;  Location: [facility];  Service: Endoscopy;  Laterality: N/A;  . EUS N/A 04/04/2020   Procedure: UPPER ENDOSCOPIC ULTRASOUND (EUS) RADIAL;  Surgeon: [name], MD;  Location: [facility];  Service: Endoscopy;  Laterality: N/A;  . FINE NEEDLE ASPIRATION N/A 04/04/2020   Procedure: FINE NEEDLE ASPIRATION (FNA) LINEAR;  Surgeon: [name], MD;  Location: [facility];  Service: Endoscopy;  Laterality: N/A;  . KNEE ARTHROSCOPY         Family History  Problem Relation Age of Onset  . Diverticulitis Mother   . Diverticulitis Sister   . Colon cancer Neg Hx   . Esophageal cancer Neg Hx   . Rectal cancer Neg Hx   . Stomach cancer Neg Hx     Social History   Tobacco Use  . Smoking status: Current Every Day Smoker    Packs/day: 0.50    Types: Cigarettes  . Smokeless tobacco: Never Used  Vaping Use  . Vaping Use: Never used  Substance Use Topics  . Alcohol use: Yes    Comment: very rarely  . Drug use: No    Home Medications Prior  to Admission medications   Medication Sig Start Date End Date Taking? Authorizing Provider  insulin aspart (FIASP FLEXTOUCH) 100 UNIT/ML FlexTouch Pen Inject 10 Units into the skin daily as needed (blood glucose over 200).    [provider]  insulin glargine, 2 Unit Dial, (TOUJEO MAX SOLOSTAR) 300 UNIT/ML Solostar Pen Inject 24 Units into the skin daily.     [provider]  naproxen sodium (ALEVE) 220 MG tablet Take 220 mg by mouth daily as needed (pain).    [provider]  ondansetron (ZOFRAN) 4 MG tablet Take 1 tablet (4 mg total) by mouth every 8 (eight) hours as needed for nausea or vomiting.  04/05/20   Hayden Rasmussen, MD  oxyCODONE-acetaminophen (PERCOCET/ROXICET) 5-325 MG tablet Take 1-2 tablets by mouth every 6 (six) hours as needed for severe pain. 04/05/20   Hayden Rasmussen, MD    Allergies    Patient has no known allergies.  Review of Systems   Review of Systems  Constitutional: Negative for fever.  HENT: Negative for sore throat.   Eyes: Negative for visual disturbance.  Respiratory: Negative for cough and shortness of breath.   Cardiovascular: Negative for chest pain.  Gastrointestinal: Positive for abdominal pain and nausea. Negative for diarrhea, hematemesis, hematochezia and vomiting.  Genitourinary: Negative for dysuria and hematuria.  Musculoskeletal: Positive for back pain.  Skin: Negative for rash.  Neurological: Negative for headaches.    Physical Exam Updated Vital Signs BP (!) 136/93 (BP Location: Left Arm)   Pulse 78   Temp 98.3 F (36.8 C) (Oral)   Resp 18   Ht 5\' 11"  (1.803 m)   Wt 95.3 kg   SpO2 95%   BMI 29.29 kg/m   Physical Exam Vitals and nursing note reviewed.  Constitutional:      Appearance: Normal appearance. He is well-developed.  HENT:     Head: Normocephalic and atraumatic.  Eyes:     Conjunctiva/sclera: Conjunctivae normal.  Cardiovascular:     Rate and Rhythm: Normal rate and regular rhythm.     Heart sounds: No murmur heard.   Pulmonary:     Effort: Pulmonary effort is normal. No respiratory distress.     Breath sounds: Normal breath sounds.  Abdominal:     Palpations: Abdomen is soft.     Tenderness: There is abdominal tenderness (meg and luq). There is no guarding or rebound.  Musculoskeletal:        General: No deformity or signs of injury. Normal range of motion.     Cervical back: Neck supple.  Skin:    General: Skin is warm and dry.     Capillary Refill: Capillary refill takes less than 2 seconds.  Neurological:     General: No focal deficit present.     Mental Status: He is alert.     Sensory: No  sensory deficit.     Motor: No weakness.     Gait: Gait normal.     ED Results / Procedures / Treatments   Labs (all labs ordered are listed, but only abnormal results are displayed) Labs Reviewed  SARS CORONAVIRUS 2 BY RT PCR ([facility]) - Abnormal; Notable for the following components:      Result Value   SARS Coronavirus 2 POSITIVE (*)    All other components within normal limits  LIPASE, BLOOD - Abnormal; Notable for the following components:   Lipase 106 (*)    All other components within normal  limits  COMPREHENSIVE METABOLIC PANEL - Abnormal; Notable for the following components:   Glucose, Bld 172 (*)    Calcium 8.7 (*)    All other components within normal limits  URINALYSIS, ROUTINE W REFLEX MICROSCOPIC - Abnormal; Notable for the following components:   Hgb urine dipstick SMALL (*)    Ketones, ur 5 (*)    Bacteria, UA RARE (*)    All other components within normal limits  C-REACTIVE PROTEIN - Abnormal; Notable for the following components:   CRP 3.2 (*)    All other components within normal limits  D-DIMER, QUANTITATIVE (NOT AT [facility]) - Abnormal; Notable for the following components:   D-Dimer, Quant 2.54 (*)    All other components within normal limits  CBC - Abnormal; Notable for the following components:   Hemoglobin 12.6 (*)    HCT 38.8 (*)    All other components within normal limits  CBC - Abnormal; Notable for the following components:   Hemoglobin 12.4 (*)    HCT 38.6 (*)    All other components within normal limits  BASIC METABOLIC PANEL - Abnormal; Notable for the following components:   Calcium 8.4 (*)    All other components within normal limits  LIPID PANEL - Abnormal; Notable for the following components:   HDL 25 (*)    LDL Cholesterol 147 (*)    All other components within normal limits  CBC  HIV ANTIBODY (ROUTINE TESTING W REFLEX)  CREATININE, SERUM  HEMOGLOBIN A1C  CBG MONITORING, ED     EKG None  Radiology DG CHEST PORT 1 VIEW  Result Date: 04/08/2020 CLINICAL DATA:  COVID-19 positive. EXAM: PORTABLE CHEST 1 VIEW COMPARISON:  None. FINDINGS: Patchy heterogeneous bilateral lung opacities in a basilar predominant distribution. Heart is normal in size with normal mediastinal contours. No pneumothorax or pleural effusion. No acute osseous abnormalities are seen. IMPRESSION: Patchy heterogeneous bilateral lung opacities in a basilar predominant distribution, pattern consistent with COVID-19 pneumonia. Electronically Signed   By: Keith Rake M.D.   On: 04/08/2020 21:26    Procedures Procedures (including critical care time)  Medications Ordered in ED Medications  sodium chloride flush (NS) 0.9 % injection 3 mL (0 mLs Intravenous Hold 04/08/20 1639)  ondansetron (ZOFRAN) tablet 4 mg (has no administration in time range)  enoxaparin (LOVENOX) injection 40 mg (40 mg Subcutaneous Given 04/08/20 2237)  sodium chloride flush (NS) 0.9 % injection 3 mL (3 mLs Intravenous Given 04/09/20 1014)  sodium chloride flush (NS) 0.9 % injection 3 mL (has no administration in time range)  0.9 %  sodium chloride infusion (has no administration in time range)  acetaminophen (TYLENOL) tablet 650 mg (has no administration in time range)    Or  acetaminophen (TYLENOL) suppository 650 mg (has no administration in time range)  ondansetron (ZOFRAN) tablet 4 mg (has no administration in time range)    Or  ondansetron (ZOFRAN) injection 4 mg (has no administration in time range)  pantoprazole (PROTONIX) injection 40 mg (40 mg Intravenous Given 04/09/20 1012)  lactated ringers infusion ( Intravenous New Bag/Given 04/08/20 2236)  insulin aspart (novoLOG) injection 0-9 Units (0 Units Subcutaneous Not Given 04/09/20 0824)  morphine 2 MG/ML injection 2 mg (has no administration in time range)  oxyCODONE (Oxy IR/ROXICODONE) immediate release tablet 5 mg (5 mg Oral Given 04/09/20 1013)  sodium chloride  0.9 % bolus 1,000 mL (0 mLs Intravenous Stopped 04/08/20 2209)  morphine 4 MG/ML injection 4 mg (4 mg Intravenous Given 04/08/20 1706)  ondansetron [facility]) injection 4 mg (4 mg Intravenous Given 04/08/20 1707)  ciprofloxacin (CIPRO) IVPB 400 mg (0 mg Intravenous Stopped 04/08/20 2209)  metroNIDAZOLE (FLAGYL) IVPB 500 mg (0 mg Intravenous Stopped 04/08/20 1808)    ED Course  I have reviewed the triage vital signs and the nursing notes.  Pertinent labs & imaging results that were available during my care of the patient were reviewed by me and considered in my medical decision making (see chart for details).  Clinical Course as of Apr 09 1033  Mon Apr 08, 2020  1708 Discussed with Dr. Carlton Adam GI who recommends the patient be admitted to the medical service, IV fluids, pain control, Cipro and Flagyl, clear liquid diet as tolerated.   [MB]    Clinical Course User Index [MB] Hayden Rasmussen, MD   MDM Rules/Calculators/A&P                          This patient complains of upper abdominal pain radiating through to back; this involves an extensive number of treatment Options and is a complaint that carries with it a high risk of complications and Morbidity. The differential includes pancreatitis, peptic ulcer disease, cholelithiasis, cholecystitis  I ordered, reviewed and interpreted labs, which included CBC with normal white count, slightly low hemoglobin, normal chemistries other than a low calcium, elevated lipase although improved from a few days ago. I ordered medication IV fluids and pain medication, Cipro and Flagyl per GI recommendations Previous records obtained and reviewed in epic including procedure note by Dr. [place] I consulted Dr. Early Chars GI and [facility] hospitalist Dr. Cathlean Sauer and discussed lab and imaging findings  Critical Interventions: None  After the interventions stated above, I reevaluated the patient and found patient to be hemodynamically stable.  Is agreeable  to admission for management of symptoms.  Final Clinical Impression(s) / ED Diagnoses Final diagnoses:  Acute pancreatitis, unspecified complication status, unspecified pancreatitis type    Rx / DC Orders ED Discharge Orders    None       Hayden Rasmussen, MD 04/09/20 1039

## 2020-04-08 NOTE — ED Notes (Signed)
Patient given Beef broth

## 2020-04-08 NOTE — ED Notes (Signed)
Also sent a dark green and lactic to lab

## 2020-04-08 NOTE — H&P (Addendum)
History and Physical    Matthias Bogus YNW:[id number] DOB: September 21, 1961 DOA: 04/08/2020  PCP: Jilda Panda, MD   Patient coming from: Home   Chief Complaint: abdominal pain   HPI: Moritz Lever is a 59 y.o. male with medical history significant of dyslipidemia, gastritis and recent episode of post procedure pancreatitis.   Patient had a endoscopic ultrasound with FNA of the pancreas on June 17/21, by Dr. Ardis Hughs from [place], as a part of work-up for weight loss, new diabetes mellitus and persistent abdominal pain.  He had an abdominal MRI on May 10 which showed probably focal acute pancreatitis involving the pancreatic body, with neoplasm considered less likely.  No evidence of pancreatic necrosis or pseudocyst.  After procedure he had significant abdominal pain and presented to [facility] the ED the following day on April 05, 2020.  His lipase was elevated up to 188.  He received analgesics with improvement of his symptoms, he was offer hospitalization but he declined and he was discharged home with oral opiate analgesics.  At home his epigastric pain mildly improved but remained persistent, over the last 24 hours it has been more severe in intensity, 10/10, sharp in nature, radiated to the back, worse with eating, no improving factors, no associated diarrhea, nausea vomiting.   ED Course: Patient in severe pain, he received 4 mg of intravenous morphine, gastroenterology was called over the phone and recommended antibiotic therapy with ciprofloxacin and metronidazole.   Review of Systems:  1. General: No fevers, no chills, no weight gain or weight loss 2. ENT: No runny nose or sore throat, no hearing disturbances 3. Pulmonary: No dyspnea, cough, wheezing, or hemoptysis 4. Cardiovascular: No angina, claudication, lower extremity edema, pnd or orthopnea 5. Gastrointestinal: No nausea or vomiting, no diarrhea or constipation. Positive abdominal pain as mentioned in HPI, decreased po intake due to  pain.  6. Hematology: No easy bruisability or frequent infections 7. Urology: No dysuria, hematuria or increased urinary frequency 8. Dermatology: No rashes. 9. Neurology: No seizures or paresthesias 10. Musculoskeletal: No joint pain or deformities  Past Medical History:  Diagnosis Date  . Chronic gastritis   . Diverticulosis of colon    left side  . Hyperlipidemia   . Tubular adenoma of colon 12/2019    Past Surgical History:  Procedure Laterality Date  . COLONOSCOPY N/A 09/06/2014   Procedure: COLONOSCOPY;  Surgeon: [name], MD;  Location: [facility]COPY;  Service: Endoscopy;  Laterality: N/A;  . ESOPHAGOGASTRODUODENOSCOPY    . ESOPHAGOGASTRODUODENOSCOPY (EGD) WITH PROPOFOL N/A 04/04/2020   Procedure: ESOPHAGOGASTRODUODENOSCOPY (EGD) WITH PROPOFOL;  Surgeon: [name], MD;  Location: [facility];  Service: Endoscopy;  Laterality: N/A;  . EUS N/A 04/04/2020   Procedure: UPPER ENDOSCOPIC ULTRASOUND (EUS) RADIAL;  Surgeon: [name], MD;  Location: [facility];  Service: Endoscopy;  Laterality: N/A;  . FINE NEEDLE ASPIRATION N/A 04/04/2020   Procedure: FINE NEEDLE ASPIRATION (FNA) LINEAR;  Surgeon: [name], MD;  Location: [facility];  Service: Endoscopy;  Laterality: N/A;  . KNEE ARTHROSCOPY       reports that he has been smoking cigarettes. He has been smoking about 0.50 packs per day. He has never used smokeless tobacco. He reports current alcohol use. He reports that he does not use drugs.  No Known Allergies  Family History  Problem Relation Age of Onset  . Diverticulitis Mother   . Diverticulitis Sister   . Colon cancer Neg Hx   . Esophageal cancer Neg Hx   .  Rectal cancer Neg Hx   . Stomach cancer Neg Hx      Prior to Admission medications   Medication Sig Start Date End Date Taking? Authorizing Provider  insulin aspart (FIASP FLEXTOUCH) 100 UNIT/ML FlexTouch Pen Inject 10 Units into the skin daily as needed (blood glucose over 200).     [provider]  insulin glargine, 2 Unit Dial, (TOUJEO MAX SOLOSTAR) 300 UNIT/ML Solostar Pen Inject 24 Units into the skin daily.     [provider]  naproxen sodium (ALEVE) 220 MG tablet Take 220 mg by mouth daily as needed (pain).    [provider]  ondansetron (ZOFRAN) 4 MG tablet Take 1 tablet (4 mg total) by mouth every 8 (eight) hours as needed for nausea or vomiting. 04/05/20   Hayden Rasmussen, MD  oxyCODONE-acetaminophen (PERCOCET/ROXICET) 5-325 MG tablet Take 1-2 tablets by mouth every 6 (six) hours as needed for severe pain. 04/05/20   Hayden Rasmussen, MD    Physical Exam: Vitals:   04/08/20 1157 04/08/20 1209 04/08/20 1430 04/08/20 1655  BP: (!) 144/84  (!) 136/93 (!) 151/93  Pulse: 81  78 75  Resp: 16  18 16   Temp: 98.3 F (36.8 C)     TempSrc: Oral     SpO2: 95%  95% 98%  Weight:  95.3 kg    Height:  5\' 11"  (1.803 m)      Vitals:   04/08/20 1157 04/08/20 1209 04/08/20 1430 04/08/20 1655  BP: (!) 144/84  (!) 136/93 (!) 151/93  Pulse: 81  78 75  Resp: 16  18 16   Temp: 98.3 F (36.8 C)     TempSrc: Oral     SpO2: 95%  95% 98%  Weight:  95.3 kg    Height:  5\' 11"  (1.803 m)     General: deconditioned and in pain.  Neurology: Awake and alert, non focal Head and Neck. Head normocephalic. Neck supple with no adenopathy or thyromegaly.   E ENT: no pallor, no icterus, oral mucosa dry. Cardiovascular: No JVD. S1-S2 present, rhythmic, no gallops, rubs, or murmurs. No lower extremity edema. Pulmonary: positive breath sounds bilaterally, adequate air movement, no wheezing, rhonchi or rales. Gastrointestinal. Abdomen mild distended with no organomegaly, tender to deep palpation at the epigastric region with no rebound or gaurding. Skin. No rashes Musculoskeletal: no joint deformities    Labs on Admission: I have personally reviewed following labs and imaging studies  CBC: Recent Labs  Lab 04/05/20 1705 04/08/20 1243  WBC 9.5 8.9    HGB 13.5 13.4  HCT 41.8 41.9  MCV 87.6 85.9  PLT 337 371   Basic Metabolic Panel: Recent Labs  Lab 04/05/20 1705 04/08/20 1243  NA 140 139  K 3.6 3.7  CL 108 105  CO2 22 26  GLUCOSE 173* 172*  BUN 14 13  CREATININE 0.91 0.89  CALCIUM 8.8* 8.7*   GFR: Estimated Creatinine Clearance: 107.9 mL/min (by C-G formula based on SCr of 0.89 mg/dL). Liver Function Tests: Recent Labs  Lab 04/05/20 1705 04/08/20 1243  AST 17 16  ALT 17 14  ALKPHOS 54 51  BILITOT 0.6 0.7  PROT 7.3 7.6  ALBUMIN 3.9 3.7   Recent Labs  Lab 04/05/20 1705 04/08/20 1243  LIPASE 188* 106*   No results for input(s): AMMONIA in the last 168 hours. Coagulation Profile: No results for input(s): INR, PROTIME in the last 168 hours. Cardiac Enzymes: No results for input(s): CKTOTAL, CKMB, CKMBINDEX, TROPONINI in the  last 168 hours. BNP (last 3 results) No results for input(s): PROBNP in the last 8760 hours. HbA1C: No results for input(s): HGBA1C in the last 72 hours. CBG: Recent Labs  Lab 04/04/20 0718  GLUCAP 139*   Lipid Profile: No results for input(s): CHOL, HDL, LDLCALC, TRIG, CHOLHDL, LDLDIRECT in the last 72 hours. Thyroid Function Tests: No results for input(s): TSH, T4TOTAL, FREET4, T3FREE, THYROIDAB in the last 72 hours. Anemia Panel: No results for input(s): VITAMINB12, FOLATE, FERRITIN, TIBC, IRON, RETICCTPCT in the last 72 hours. Urine analysis:    Component Value Date/Time   COLORURINE YELLOW 04/08/2020 1243   APPEARANCEUR CLEAR 04/08/2020 1243   LABSPEC 1.025 04/08/2020 1243   PHURINE 6.0 04/08/2020 1243   GLUCOSEU NEGATIVE 04/08/2020 1243   HGBUR SMALL (A) 04/08/2020 1243   BILIRUBINUR NEGATIVE 04/08/2020 1243   KETONESUR 5 (A) 04/08/2020 1243   PROTEINUR NEGATIVE 04/08/2020 1243   UROBILINOGEN 0.2 08/30/2011 0936   NITRITE NEGATIVE 04/08/2020 1243   LEUKOCYTESUR NEGATIVE 04/08/2020 1243    Radiological Exams on Admission: No results found.  EKG: Independently  reviewed. NA   Assessment/Plan Principal Problem:   Pancreatitis Active Problems:   Gastritis   Dyslipidemia   59 year old male who has been experiencing abdominal pain for at least 4 months, he had extensive work-up as an outpatient, including CT of the abdomen, upper endoscopy and colonoscopy.  Abdominal MRI and finally a pancreatic biopsy (pathology negative for malignant cells).  Postprocedure he developed pancreatitis that has failed outpatient management with oral opiate analgesics and bowel rest. Now his pain is severe in intensity, epigastric in location, radiated to the back, and worse with meals. On his initial physical examination his blood pressure is 136/93, heart rate 78, respiratory rate 18, oxygen saturation 95% on room air.  He has dry mucous membranes, lungs clear to auscultation bilaterally, heart S1-S2, present rhythmic, his abdomen is mildly distended, tender to palpation in the epigastric region, no peritoneal signs, no lower extremity edema. Sodium 139, potassium 3.7, chloride 105, bicarb 26, glucose 172, BUN 13, creatinine 0.89, lipase 106, AST 16, ALT 14, white count 8.9, hemoglobin 13.4, hematocrit 41.9, platelets 313.  SARS COVID-19 pending.  Urinalysis with 6-10 red cells, 0-5 white cells.  Patient will be admitted to the hospital with a working diagnosis of acute postprocedure pancreatitis, failed outpatient therapy.  1.  Acute postprocedure pancreatitis.  Patient will be admitted to the medical ward, continue supportive medical therapy with intravenous fluids, (balanced electrolyte solutions), intravenous antiacids, bowel rest with full liquids and as needed analgesics (IV morphine for sever pain and oral oxycodone for moderate pain) and antiemetics.  No clinical signs of bacterial infection, will hold on antibiotic therapy for now.  If worsening abdominal pain will repeat CT abdomen and pelvis.   2.  Type 2 diabetes mellitus/ dyslipidemia.  At home patient is on  glargine and insulin sliding scale, his admission glucose is 172.  Will place patient on a sensitive insulin sliding scale for now, capillary glucose monitoring 4 times daily.  Patient not on statin therapy, will check lipid profile.   3.  Gastritis.  Upper endoscopy from March 2021 showed mild nonspecific gastritis.  Will continue antiacid therapy with pantoprazole intravenously.  Status is: Inpatient  Remains inpatient appropriate because:IV treatments appropriate due to intensity of illness or inability to take PO   Dispo: The patient is from: Home              Anticipated d/c is to: Home  Anticipated d/c date is: 2 days              Patient currently is not medically stable to d/c.   DVT prophylaxis: Enoxaparin   Code Status:   full  Family Communication:  No family at the bedside     Consults called:  None   Admission status:  Inpatient.    Kiandre Spagnolo Gerome Apley MD [facility]   04/08/2020, 5:19 PM

## 2020-04-09 DIAGNOSIS — U071 COVID-19: Principal | ICD-10-CM

## 2020-04-09 DIAGNOSIS — K859 Acute pancreatitis without necrosis or infection, unspecified: Secondary | ICD-10-CM

## 2020-04-09 LAB — CBG MONITORING, ED
Glucose-Capillary: 89 mg/dL (ref 70–99)
Glucose-Capillary: 145 mg/dL — ABNORMAL HIGH (ref 70–99)
Glucose-Capillary: 96 mg/dL (ref 70–99)
Glucose-Capillary: 97 mg/dL (ref 70–99)

## 2020-04-09 LAB — BASIC METABOLIC PANEL
Anion gap: 9 (ref 5–15)
BUN: 10 mg/dL (ref 6–20)
CO2: 22 mmol/L (ref 22–32)
Calcium: 8.4 mg/dL — ABNORMAL LOW (ref 8.9–10.3)
Chloride: 106 mmol/L (ref 98–111)
Creatinine, Ser: 0.85 mg/dL (ref 0.61–1.24)
GFR calc Af Amer: >60 mL/min (ref >60)
GFR calc non Af Amer: >60 mL/min (ref >60)
Glucose, Bld: 93 mg/dL (ref 70–99)
Potassium: 3.9 mmol/L (ref 3.5–5.1)
Sodium: 137 mmol/L (ref 135–145)

## 2020-04-09 LAB — CBC
HCT: 38.6 % — ABNORMAL LOW (ref 39.0–52.0)
Hemoglobin: 12.4 g/dL — ABNORMAL LOW (ref 13.0–17.0)
MCH: 27.9 pg (ref 26.0–34.0)
MCHC: 32.1 g/dL (ref 30.0–36.0)
MCV: 86.7 fL (ref 80.0–100.0)
Platelets: 337 10*3/uL (ref 150–400)
RBC: 4.45 MIL/uL (ref 4.22–5.81)
RDW: 15.1 % (ref 11.5–15.5)
WBC: 8.8 10*3/uL (ref 4.0–10.5)
nRBC: 0 % (ref 0.0–0.2)

## 2020-04-09 LAB — LIPID PANEL
Cholesterol: 195 mg/dL (ref 0–200)
HDL: 25 mg/dL — ABNORMAL LOW (ref >40)
LDL Cholesterol: 147 mg/dL — ABNORMAL HIGH (ref 0–99)
Total CHOL/HDL Ratio: 7.8 RATIO
Triglycerides: 115 mg/dL (ref <150)
VLDL: 23 mg/dL (ref 0–40)

## 2020-04-09 LAB — HIV ANTIBODY (ROUTINE TESTING W REFLEX): HIV Screen 4th Generation wRfx: NONREACTIVE

## 2020-04-09 NOTE — ED Notes (Signed)
Pt assisted to transfer to hospital bed, pt able to transfer independently. No other needs expressed.

## 2020-04-09 NOTE — Progress Notes (Signed)
Darrell Wyatt  JAS:[id number] DOB: 1961/01/25 DOA: 04/08/2020 PCP: Jilda Panda, MD    Brief Narrative:  59 year old with a history of 4 months of epigastric abdominal pain, HLD, gastritis, and a recent episode of postprocedural pancreatitis.  He underwent an endoscopic [place] with FNA of the pancreas June 17 per Dr. Ardis Hughs with the [facility] as part of a work-up for weight loss, new DM, and persistent epigastric abdominal pain.  Abdominal MRI May 10 suggested focal acute pancreatitis of the pancreatic body.  There is no evidence of necrosis or pseudocyst.  After his biopsy procedure he developed significant worsening of abdominal pain and presented to the [facility] ED June 18.  Lipase was elevated at 188 but after analgesics he was able to be discharged home.  Since that time he reports that his epigastric pain has persisted and increased acutely and severely over the 24 hours prior to his return to the ED 6/21.  Significant Events: 3/11 negative Covid test 6/14 negative Covid test 6/21 positive Covid test -admit to [facility] via ED  Antimicrobials:  None  Subjective: The patient is sitting up in bed stating he feels better overall.  He does report that he got severe epigastric and back pain when he attempted to eat grits earlier today.  He denies nausea or vomiting at present.  He denies current pain or shortness of breath.  He denies recent fevers or known exposure to Covid.  Assessment & Plan:  Acute pancreatitis - mild  Idiopathic -significant pain with attempts to eat soft diet this morning -clear liquids only for now -GI following -further investigation as per GI -status post EUS with FNA 04/04/2020 with no appreciated mass and no pathologic findings on biopsy  SARS-CoV-2 test positive -status post Pfizer vaccine CXR worrisome for evidence of bilateral pulmonary infiltrate consistent with active Covid, but the patient is entirely asymptomatic -it is certainly possible that the  patient developed Covid between the time of 6/14 and his presentation yesterday -perhaps his lack of symptoms is related to the fact that he was previously vaccinated fully -at present directed therapy is not indicated -the patient is quite upset about this positive test and doubts its validity -I have discussed with him the fact that a false positive with this test is exceedingly unlikely but I am willing to retest at his request -continue full isolation -he tells me that all of his close family members have all been vaccinated as well and that no one else is sick at this time -follow-up CXR in a.m.  DM Well-controlled at this time -follow trend  HLD Hold treatment for now until diet is advanced  Gastritis Noted via EGD March 2021  DVT prophylaxis: Lovenox Code Status: FULL CODE Family Communication:  Status is: Inpatient  Remains inpatient appropriate because:Inpatient level of care appropriate due to severity of illness   Dispo: The patient is from: Home              Anticipated d/c is to: Home              Anticipated d/c date is: 2 days              Patient currently is not medically stable to d/c.  Consultants:  none  Objective: Blood pressure (!) 142/95, pulse 69, temperature 98.3 F (36.8 C), temperature source Oral, resp. rate 16, height 5\' 11"  (1.803 m), weight 95.3 kg, SpO2 96 %. No intake or output data in the 24 hours ending  04/09/20 0905 Filed Weights   04/08/20 1209  Weight: 95.3 kg    Examination: General: No acute respiratory distress Lungs: No appreciable crackles or wheezes on exam with good air movement throughout Cardiovascular: Regular rate and rhythm without murmur gallop or rub normal S1 and S2 Abdomen: Tender to palpation epigastrium but not distended with bowel sounds positive and no appreciable mass Extremities: No significant cyanosis, clubbing, or edema bilateral lower extremities  CBC: Recent Labs  Lab 04/08/20 1243 04/08/20 2241  04/09/20 0514  WBC 8.9 9.5 8.8  HGB 13.4 12.6* 12.4*  HCT 41.9 38.8* 38.6*  MCV 85.9 86.4 86.7  PLT 313 317 916   Basic Metabolic Panel: Recent Labs  Lab 04/05/20 1705 04/05/20 1705 04/08/20 1243 04/08/20 2241 04/09/20 0613  NA 140  --  139  --  137  K 3.6  --  3.7  --  3.9  CL 108  --  105  --  106  CO2 22  --  26  --  22  GLUCOSE 173*  --  172*  --  93  BUN 14  --  13  --  10  CREATININE 0.91   < > 0.89 0.78 0.85  CALCIUM 8.8*  --  8.7*  --  8.4*   < > = values in this interval not displayed.   GFR: Estimated Creatinine Clearance: 113 mL/min (by C-G formula based on SCr of 0.85 mg/dL).  Liver Function Tests: Recent Labs  Lab 04/05/20 1705 04/08/20 1243  AST 17 16  ALT 17 14  ALKPHOS 54 51  BILITOT 0.6 0.7  PROT 7.3 7.6  ALBUMIN 3.9 3.7   Recent Labs  Lab 04/05/20 1705 04/08/20 1243  LIPASE 188* 106*   CBG: Recent Labs  Lab 04/04/20 0718 04/09/20 0823  GLUCAP 139* 89    Recent Results (from the past 240 hour(s))  SARS Coronavirus 2 (TAT 6-24 hrs)     Status: None   Collection Time: 04/01/20 12:00 AM  Result Value Ref Range Status   SARS Coronavirus 2 RESULT: NEGATIVE  Final    Comment: RESULT: NEGATIVESARS-CoV-2 INTERPRETATION:A NEGATIVE  test result means that SARS-CoV-2 RNA was not present in the specimen above the limit of detection of this test. This does not preclude a possible SARS-CoV-2 infection and should not be used as the  sole basis for patient management decisions. Negative results must be combined with clinical observations, patient history, and epidemiological information. Optimum specimen types and timing for peak viral levels during infections caused by SARS-CoV-2  have not been determined. Collection of multiple specimens or types of specimens may be necessary to detect virus. Improper specimen collection and handling, sequence variability under primers/probes, or organism present below the limit of detection may  lead to false  negative results. Positive and negative predictive values of testing are highly dependent on prevalence. False negative test results are more likely when prevalence of disease is high.The expected result is NEGATIVE.Fact S heet for  Healthcare Providers: LocalChronicle.no Sheet for Patients: SalonLookup.es Reference Range - Negative   SARS Coronavirus 2 by RT PCR (hospital order, performed in [facility] hospital lab) Nasopharyngeal Nasopharyngeal Swab     Status: Abnormal   Collection Time: 04/08/20  6:11 PM   Specimen: Nasopharyngeal Swab  Result Value Ref Range Status   SARS Coronavirus 2 POSITIVE (A) NEGATIVE Final    Comment: RESULT CALLED TO, READ BACK BY AND VERIFIED WITH: HODGES,S. RN @2022  04/08/20 BILLINGSLEY,L (NOTE) SARS-CoV-2 target nucleic acids are DETECTED  SARS-CoV-2 RNA is generally detectable in upper respiratory specimens  during the acute phase of infection.  Positive results are indicative  of the presence of the identified virus, but do not rule out bacterial infection or co-infection with other pathogens not detected by the test.  Clinical correlation with patient history and  other diagnostic information is necessary to determine patient infection status.  The expected result is negative.  Fact Sheet for Patients:   StrictlyIdeas.no   Fact Sheet for Healthcare Providers:   BankingDealers.co.za    This test is not yet approved or cleared by the [place] FDA and  has been authorized for detection and/or diagnosis of SARS-CoV-2 by FDA under an Emergency Use Authorization (EUA).  This EUA will remain in effect (meaning t his test can be used) for the duration of  the COVID-19 declaration under Section 564(b)(1) of the Act, 21 U.S.C. section 360-bbb-3(b)(1), unless the authorization is terminated or revoked sooner.  Performed at [facility], [place] [address] [place], [place] [address]      Scheduled Meds: . enoxaparin (LOVENOX) injection  40 mg Subcutaneous Q24H  . insulin aspart  0-9 Units Subcutaneous TID WC  . pantoprazole (PROTONIX) IV  40 mg Intravenous Q12H  . sodium chloride flush  3 mL Intravenous Once  . sodium chloride flush  3 mL Intravenous Q12H   Continuous Infusions: . sodium chloride    . lactated ringers 75 mL/hr at 04/08/20 2236     LOS: 1 day   Cherene Altes, MD [facility] Office  [phone] Pager - Text Page per Amion  If 7PM-7AM, please contact night-coverage per Amion 04/09/2020, 9:05 AM

## 2020-04-09 NOTE — Consult Note (Signed)
Referring Provider:  [facility]         Primary Care Physician:  Jilda Panda, MD Primary Gastroenterologist:   Oretha Caprice, MD               We were asked to see this patient for:  pancreatitis                ASSESSMENT /  PLAN    Darrell Wyatt is a 59 y.o. male PMH significant for, but not necessarily limited to, diverticulosis, colon polyps, gastritis, diabetes  # Pancreatitis, mild --Etiology unclear, work-up has been ongoing over the last few weeks.  --Status post EUS with FNA 04/04/2020.  No mass is seen but overall anatomy appeared somewhat abnormal.  Though no masses found, parenchyma was sampled.  Cytology negative for malignancy --He received a one-time dose of Cipro and Flagyl yesterday.  I do not think ongoing antibiotics are necessary --Patient is not particularly tender on exam.  White count normal, no evidence for hemoconcentration, normal renal function.  Will try trial of clear liquids.  --Hopefully home within next 1-2 days.    # COVID positive.  --Asymptomatic --He received both vaccines months ago.    HPI:    Chief Complaint: abdominal pain  Darrell Wyatt is a 59 y.o. male who has been undergoing work-up by [place] for nausea, vomiting,  abdominal pain and weight loss. CT scan of the abdomen and pelvis with contrast mid February 2021 was unrevealing.  He then had an EGD and colonoscopy in March of this year with findings of mild gastritis, diverticulosis and a couple of colon polyps.  He continued to have intermittent nausea, vomiting.  02/09/2020 GI office visit for ongoing symptoms.  Complained of persistent postprandial abdominal pain.  Nausea and vomiting had improved on Dexilant.  Pain radiating through to his back, lipase ordered and returned at 440.  MRI of the abdomen was ordered, completed 02/26/2020 and suggestive of probable focal acute pancreatitis involving the pancreatic body, neoplasm considered less likely.  Dr. Ardis Hughs felt like EUS was  next best step since MRI was abnormal and clinical course had been unusual.  INTERVAL HISTORY:   Patient presented to the ED yesterday with complaints of recurrent abdominal pain.  He says the abdominal pain had actually resolved several weeks ago but recurred after eating a hotdog following EUS on 04/04/2020.  He was seen in the ED on 04/05/2020.   Lipase was elevated at 188.  He received analgesics,  was offered hospitalization but declined.  He came back to the ED yesterday for ongoing left-sided abdominal pain.  Lipase was 106, LFTs normal.  White count normal . The ED spoke with our office, we recommended antibiotics.  He has been admitted for further evaluation.  Received pain medication this morning, currently not having any pain.  Patient associates left mid abdominal pain with eating.  The pain sometimes radiates through to his back.  Patient says he has lost 29 pounds since all of these symptoms started. He has suprisingly still positive for Covid, he is asymptomatic.   PREVIOUS ENDOSCOPIC EVALUATIONS / GI STUDIES :  12/01/19 CT scan  abd/ pelvis with contrast --Fatty infiltration of the liver, left colonic diverticulosis, no acute findings  01/01/2020 EGD --Mild gastritis  01/01/2020 colonoscopy --Complete exam, good bowel prep --Two 2 to 3 mm polyps in the descending colon and in the transverse colon, removed with a cold snare. Resected and retrieved. - Diverticulosis in the left colon. - The  examination was otherwise normal on direct and retroflexion views  Surgical [P], colon, descending and transverse, polyp (2) - TUBULAR ADENOMA, NEGATIVE FOR HIGH GRADE DYSPLASIA (X2). 2. Surgical [P], gastric antrum and gastric body - MILDLY ACTIVE CHRONIC GASTRITIS, SEE COMMENT. No H. Pylori  02/26/20 MRI  --probable focal acute pancreatitis involving the pancreatic body, neoplasm considered less likely.  04/04/20  EUS The overall anatomy of the pancreas was somewhat abnormal, with subtle  'partitioning' of the pancreas along vascular planes of splenic artery, vein (anatomic variant?). The main pancreatic duct was normal throughout it's course in the uncinate, head, and body however it was dilated to 3.32mm in the tail. There was not an obvious mass at the site of ductal caliber change however given the presenting symptoms I sampled the pancreatic parenchyma at the site with two transgastric passes with a 22 gauge EUS FNA needle. Final cytology results are pending. - A few hyperechoic strands and foci throughout the pancreas.  Cytology --Clinical History: Abnormal pancreas  Specimen Submitted: A. PANCREAS, BODY, TAIL, FINE NEEDLE ASPIRATION:   FINAL MICROSCOPIC DIAGNOSIS:  - No malignant cells identified  - Benign glandular and pancreatic cells.    Past Medical History:  Diagnosis Date  . Chronic gastritis   . Diverticulosis of colon    left side  . Hyperlipidemia   . Tubular adenoma of colon 12/2019    Past Surgical History:  Procedure Laterality Date  . COLONOSCOPY N/A 09/06/2014   Procedure: COLONOSCOPY;  Surgeon: [name], MD;  Location: [facility];  Service: Endoscopy;  Laterality: N/A;  . ESOPHAGOGASTRODUODENOSCOPY    . ESOPHAGOGASTRODUODENOSCOPY (EGD) WITH PROPOFOL N/A 04/04/2020   Procedure: ESOPHAGOGASTRODUODENOSCOPY (EGD) WITH PROPOFOL;  Surgeon: [name], MD;  Location: [facility];  Service: Endoscopy;  Laterality: N/A;  . EUS N/A 04/04/2020   Procedure: UPPER ENDOSCOPIC ULTRASOUND (EUS) RADIAL;  Surgeon: [name], MD;  Location: [facility];  Service: Endoscopy;  Laterality: N/A;  . FINE NEEDLE ASPIRATION N/A 04/04/2020   Procedure: FINE NEEDLE ASPIRATION (FNA) LINEAR;  Surgeon: [name], MD;  Location: [facility];  Service: Endoscopy;  Laterality: N/A;  . KNEE ARTHROSCOPY      Prior to Admission medications   Medication Sig Start Date End Date Taking? Authorizing Provider  insulin aspart (FIASP FLEXTOUCH) 100  UNIT/ML FlexTouch Pen Inject 10 Units into the skin daily as needed (blood glucose over 200).   Yes [provider]  insulin glargine, 2 Unit Dial, (TOUJEO MAX SOLOSTAR) 300 UNIT/ML Solostar Pen Inject 24 Units into the skin daily.    Yes [provider]  naproxen sodium (ALEVE) 220 MG tablet Take 220 mg by mouth daily as needed (pain).   Yes [provider]  ondansetron (ZOFRAN) 4 MG tablet Take 1 tablet (4 mg total) by mouth every 8 (eight) hours as needed for nausea or vomiting. 04/05/20  Yes Hayden Rasmussen, MD  oxyCODONE-acetaminophen (PERCOCET/ROXICET) 5-325 MG tablet Take 1-2 tablets by mouth every 6 (six) hours as needed for severe pain. 04/05/20  Yes Hayden Rasmussen, MD    Current Facility-Administered Medications  Medication Dose Route Frequency Provider Last Rate Last Admin  . 0.9 %  sodium chloride infusion  250 mL Intravenous PRN Arrien, Jimmy Picket, MD      . acetaminophen (TYLENOL) tablet 650 mg  650 mg Oral Q6H PRN Arrien, Jimmy Picket, MD       Or  . acetaminophen (TYLENOL) suppository 650 mg  650 mg Rectal Q6H PRN  Arrien, Jimmy Picket, MD      . enoxaparin (LOVENOX) injection 40 mg  40 mg Subcutaneous Q24H Tawni Millers, MD   40 mg at 04/08/20 2237  . insulin aspart (novoLOG) injection 0-9 Units  0-9 Units Subcutaneous TID WC Arrien, Jimmy Picket, MD   1 Units at 04/09/20 1152  . lactated ringers infusion   Intravenous Continuous Tawni Millers, MD 75 mL/hr at 04/09/20 1154 New Bag at 04/09/20 1154  . morphine 2 MG/ML injection 2 mg  2 mg Intravenous Q2H PRN Arrien, Jimmy Picket, MD      . ondansetron [facility]) tablet 4 mg  4 mg Oral Q6H PRN Arrien, Jimmy Picket, MD       Or  . ondansetron [facility]) injection 4 mg  4 mg Intravenous Q6H PRN Arrien, Jimmy Picket, MD      . ondansetron [facility]) tablet 4 mg  4 mg Oral Q8H PRN Arrien, Jimmy Picket, MD      . oxyCODONE (Oxy IR/ROXICODONE) immediate release tablet  5 mg  5 mg Oral Q4H PRN Arrien, Jimmy Picket, MD   5 mg at 04/09/20 1013  . pantoprazole (PROTONIX) injection 40 mg  40 mg Intravenous Q12H Arrien, Jimmy Picket, MD   40 mg at 04/09/20 1012  . sodium chloride flush (NS) 0.9 % injection 3 mL  3 mL Intravenous Once Maudie Flakes, MD      . sodium chloride flush (NS) 0.9 % injection 3 mL  3 mL Intravenous Q12H Arrien, Jimmy Picket, MD   3 mL at 04/09/20 1014  . sodium chloride flush (NS) 0.9 % injection 3 mL  3 mL Intravenous PRN Arrien, Jimmy Picket, MD       Current Outpatient Medications  Medication Sig Dispense Refill  . insulin aspart (FIASP FLEXTOUCH) 100 UNIT/ML FlexTouch Pen Inject 10 Units into the skin daily as needed (blood glucose over 200).    . insulin glargine, 2 Unit Dial, (TOUJEO MAX SOLOSTAR) 300 UNIT/ML Solostar Pen Inject 24 Units into the skin daily.     . naproxen sodium (ALEVE) 220 MG tablet Take 220 mg by mouth daily as needed (pain).    . ondansetron (ZOFRAN) 4 MG tablet Take 1 tablet (4 mg total) by mouth every 8 (eight) hours as needed for nausea or vomiting. 15 tablet 0  . oxyCODONE-acetaminophen (PERCOCET/ROXICET) 5-325 MG tablet Take 1-2 tablets by mouth every 6 (six) hours as needed for severe pain. 10 tablet 0    Allergies as of 04/08/2020  . (No Known Allergies)    Family History  Problem Relation Age of Onset  . Diverticulitis Mother   . Diverticulitis Sister   . Colon cancer Neg Hx   . Esophageal cancer Neg Hx   . Rectal cancer Neg Hx   . Stomach cancer Neg Hx     Social History   Socioeconomic History  . Marital status: Married    Spouse name: Not on file  . Number of children: 5  . Years of education: Not on file  . Highest education level: Not on file  Occupational History  . Occupation: self employed  Tobacco Use  . Smoking status: Current Every Day Smoker    Packs/day: 0.50    Types: Cigarettes  . Smokeless tobacco: Never Used  Vaping Use  . Vaping Use: Never used    Substance and Sexual Activity  . Alcohol use: Yes    Comment: very rarely  . Drug use: No  . Sexual activity: Not  on file  Other Topics Concern  . Not on file  Social History Narrative  . Not on file   Social Determinants of Health   Financial Resource Strain:   . Difficulty of Paying Living Expenses:   Food Insecurity:   . Worried About Charity fundraiser in the Last Year:   . Arboriculturist in the Last Year:   Transportation Needs:   . Film/video editor (Medical):   Marland Kitchen Lack of Transportation (Non-Medical):   Physical Activity:   . Days of Exercise per Week:   . Minutes of Exercise per Session:   Stress:   . Feeling of Stress :   Social Connections:   . Frequency of Communication with Friends and Family:   . Frequency of Social Gatherings with Friends and Family:   . Attends Religious Services:   . Active Member of Clubs or Organizations:   . Attends Archivist Meetings:   Marland Kitchen Marital Status:   Intimate Partner Violence:   . Fear of Current or Ex-Partner:   . Emotionally Abused:   Marland Kitchen Physically Abused:   . Sexually Abused:     Review of Systems: All systems reviewed and negative except where noted in HPI.  Physical Exam: Vital signs in last 24 hours: Pulse Rate:  [62-83] 83 (06/22 1147) Resp:  [15-18] 15 (06/22 1147) BP: (125-157)/(64-99) 128/64 (06/22 1147) SpO2:  [91 %-98 %] 96 % (06/22 1147) Weight:  [95.3 kg] 95.3 kg (06/21 1209)   General:   Alert, well-developed, male in NAD Psych:  Pleasant, cooperative. Normal mood and affect. Eyes:  Pupils equal, sclera clear, no icterus.   Conjunctiva pink. Ears:  Normal auditory acuity. Nose:  No deformity, discharge,  or lesions. Neck:  Supple; no masses Lungs:  Clear throughout to auscultation.   No wheezes, crackles, or rhonchi.  Heart:  Regular rate and rhythm; no murmurs, no lower extremity edema Abdomen:  Soft, non-distended, nontender, BS active, no palp mass   Rectal:  Deferred  Msk:   Symmetrical without gross deformities. . Neurologic:  Alert and  oriented x4;  grossly normal neurologically. Skin:  Intact without significant lesions or rashes.   Intake/Output from previous day: No intake/output data recorded. Intake/Output this shift: No intake/output data recorded.  Lab Results: Recent Labs    04/08/20 1243 04/08/20 2241 04/09/20 0514  WBC 8.9 9.5 8.8  HGB 13.4 12.6* 12.4*  HCT 41.9 38.8* 38.6*  PLT 313 317 337   BMET Recent Labs    04/08/20 1243 04/08/20 2241 04/09/20 0613  NA 139  --  137  K 3.7  --  3.9  CL 105  --  106  CO2 26  --  22  GLUCOSE 172*  --  93  BUN 13  --  10  CREATININE 0.89 0.78 0.85  CALCIUM 8.7*  --  8.4*   LFT Recent Labs    04/08/20 1243  PROT 7.6  ALBUMIN 3.7  AST 16  ALT 14  ALKPHOS 51  BILITOT 0.7   PT/INR No results for input(s): LABPROT, INR in the last 72 hours. Hepatitis Panel No results for input(s): HEPBSAG, HCVAB, HEPAIGM, HEPBIGM in the last 72 hours.   . CBC Latest Ref Rng & Units 04/09/2020 04/08/2020 04/08/2020  WBC 4.0 - 10.5 K/uL 8.8 9.5 8.9  Hemoglobin 13.0 - 17.0 g/dL 12.4(L) 12.6(L) 13.4  Hematocrit 39 - 52 % 38.6(L) 38.8(L) 41.9  Platelets 150 - 400 K/uL 337 317 313    .  CMP Latest Ref Rng & Units 04/09/2020 04/08/2020 04/08/2020  Glucose 70 - 99 mg/dL 93 - 172(H)  BUN 6 - 20 mg/dL 10 - 13  Creatinine 0.61 - 1.24 mg/dL 0.85 0.78 0.89  Sodium 135 - 145 mmol/L 137 - 139  Potassium 3.5 - 5.1 mmol/L 3.9 - 3.7  Chloride 98 - 111 mmol/L 106 - 105  CO2 22 - 32 mmol/L 22 - 26  Calcium 8.9 - 10.3 mg/dL 8.4(L) - 8.7(L)  Total Protein 6.5 - 8.1 g/dL - - 7.6  Total Bilirubin 0.3 - 1.2 mg/dL - - 0.7  Alkaline Phos 38 - 126 U/L - - 51  AST 15 - 41 U/L - - 16  ALT 0 - 44 U/L - - 14   Studies/Results: DG CHEST PORT 1 VIEW  Result Date: 04/08/2020 CLINICAL DATA:  COVID-19 positive. EXAM: PORTABLE CHEST 1 VIEW COMPARISON:  None. FINDINGS: Patchy heterogeneous bilateral lung opacities in a basilar  predominant distribution. Heart is normal in size with normal mediastinal contours. No pneumothorax or pleural effusion. No acute osseous abnormalities are seen. IMPRESSION: Patchy heterogeneous bilateral lung opacities in a basilar predominant distribution, pattern consistent with COVID-19 pneumonia. Electronically Signed   By: Keith Rake M.D.   On: 04/08/2020 21:26    Principal Problem:   Pancreatitis Active Problems:   Gastritis   Dyslipidemia    Tye Savoy, NP-C @  04/09/2020, 12:04 PM

## 2020-04-09 NOTE — ED Notes (Signed)
Pt provided breakfast tray, updated to delay in transfer to inpatient.  Pt provided with hygiene supplies, no other needs expressed.  Will continue to monitor.

## 2020-04-09 NOTE — ED Notes (Signed)
Rounded on pt, pt states he is comfortable at this time and is not experiencing pain. Pt refused pain and nausea medications

## 2020-04-09 NOTE — ED Notes (Signed)
Lab reported light green was hemolyzed.  2nd tube drawn and sent.

## 2020-04-09 NOTE — ED Notes (Signed)
Pt provided with beef broth, pitcher of water, lemon icee, per his request.

## 2020-04-09 NOTE — ED Notes (Signed)
Pt declines changing into gown and taking off shoes.  Sts he is comfortable.  Pt provided a urinal and update on delay/plan of care.

## 2020-04-09 NOTE — ED Notes (Signed)
[employer] did not work when attempting to print labels.

## 2020-04-10 LAB — COMPREHENSIVE METABOLIC PANEL
ALT: 10 U/L (ref 0–44)
AST: 10 U/L — ABNORMAL LOW (ref 15–41)
Albumin: 3.2 g/dL — ABNORMAL LOW (ref 3.5–5.0)
Alkaline Phosphatase: 49 U/L (ref 38–126)
Anion gap: 7 (ref 5–15)
BUN: 8 mg/dL (ref 6–20)
CO2: 24 mmol/L (ref 22–32)
Calcium: 8.7 mg/dL — ABNORMAL LOW (ref 8.9–10.3)
Chloride: 107 mmol/L (ref 98–111)
Creatinine, Ser: 0.79 mg/dL (ref 0.61–1.24)
GFR calc Af Amer: >60 mL/min (ref >60)
GFR calc non Af Amer: >60 mL/min (ref >60)
Glucose, Bld: 98 mg/dL (ref 70–99)
Potassium: 3.7 mmol/L (ref 3.5–5.1)
Sodium: 138 mmol/L (ref 135–145)
Total Bilirubin: 0.9 mg/dL (ref 0.3–1.2)
Total Protein: 6.9 g/dL (ref 6.5–8.1)

## 2020-04-10 LAB — LIPASE, BLOOD: Lipase: 60 U/L — ABNORMAL HIGH (ref 11–51)

## 2020-04-10 LAB — C-REACTIVE PROTEIN: CRP: 6.4 mg/dL — ABNORMAL HIGH (ref <1.0)

## 2020-04-10 LAB — CBC
HCT: 37.3 % — ABNORMAL LOW (ref 39.0–52.0)
Hemoglobin: 12.2 g/dL — ABNORMAL LOW (ref 13.0–17.0)
MCH: 27.9 pg (ref 26.0–34.0)
MCHC: 32.7 g/dL (ref 30.0–36.0)
MCV: 85.2 fL (ref 80.0–100.0)
Platelets: 306 10*3/uL (ref 150–400)
RBC: 4.38 MIL/uL (ref 4.22–5.81)
RDW: 14.8 % (ref 11.5–15.5)
WBC: 9.5 10*3/uL (ref 4.0–10.5)
nRBC: 0 % (ref 0.0–0.2)

## 2020-04-10 LAB — FERRITIN: Ferritin: 147 ng/mL (ref 24–336)

## 2020-04-10 LAB — GLUCOSE, CAPILLARY
Glucose-Capillary: 98 mg/dL (ref 70–99)
Glucose-Capillary: 92 mg/dL (ref 70–99)

## 2020-04-10 LAB — HEMOGLOBIN A1C
Hgb A1c MFr Bld: 9.1 % — ABNORMAL HIGH (ref 4.8–5.6)
Mean Plasma Glucose: 214 mg/dL

## 2020-04-10 LAB — D-DIMER, QUANTITATIVE: D-Dimer, Quant: 4.42 ug/mL-FEU — ABNORMAL HIGH (ref 0.00–0.50)

## 2020-04-10 NOTE — Progress Notes (Signed)
Progress Note  CC:     Pancreatitis     ASSESSMENT AND PLAN:   Darrell Wyatt is a 59 y.o. male PMH significant for, but not necessarily limited to, diverticulosis, colon polyps, gastritis, diabetes  # Pancreatitis, mild  --Improved.  Patient has no abdominal pain.  He is upset about being hospitalized, wants to go home --Etiology pancreatitis unclear, work-up has been ongoing over the last few weeks.  --Status post EUS with FNA 04/04/2020.  No mass, overall anatomy appeared somewhat abnormal, cytology negative for malignancy --Patient is stable for discharge from GI standpoint.  Spoke with him about diet.  He should advance diet slowly to low fat diet.  Stay on low fat diet for next few weeks.  --Patient will call our office if he has recurrent abdominal pain.  Otherwise, our office will be calling him to arrange for follow up MRI per Dr. Eugenia Pancoast original recommendations following EUS   # COVID positive.  --Asymptomatic --He received both vaccines months ago.         SUBJECTIVE   No abdominal pain . Very upset, wants to go home.  Feels like he does not need to be in the hospital . He does not understand how he got Covid and wonders if it was false positive test   OBJECTIVE:     Vital signs in last 24 hours: Temp:  [99.2 F (37.3 C)-99.3 F (37.4 C)] 99.3 F (37.4 C) (06/23 0603) Pulse Rate:  [64-83] 73 (06/23 0603) Resp:  [15-19] 19 (06/23 0217) BP: (120-168)/(64-90) 120/72 (06/23 0603) SpO2:  [94 %-98 %] 94 % (06/23 0603) Weight:  [91.1 kg] 91.1 kg (06/23 0216) Last BM Date: 04/09/20 General:   Alert, in NAD Heart:  Regular rate and rhythm.  No lower extremity edema   Pulm: Normal respiratory effort   Abdomen:  Soft,  nontender, nondistended.  Normal bowel sounds.          Neurologic:  Alert and  oriented,  grossly normal neurologically. Psych:  cooperative. Aggitated.   Intake/Output from previous day: 06/22 0701 - 06/23 0700 In: 2147.1  [I.V.:2147.1] Out: -  Intake/Output this shift: Total I/O In: 240 [P.O.:240] Out: 500 [Urine:500]  Lab Results: Recent Labs    04/08/20 2241 04/09/20 0514 04/10/20 0459  WBC 9.5 8.8 9.5  HGB 12.6* 12.4* 12.2*  HCT 38.8* 38.6* 37.3*  PLT 317 337 306   BMET Recent Labs    04/08/20 1243 04/08/20 1243 04/08/20 2241 04/09/20 0613 04/10/20 0459  NA 139  --   --  137 138  K 3.7  --   --  3.9 3.7  CL 105  --   --  106 107  CO2 26  --   --  22 24  GLUCOSE 172*  --   --  93 98  BUN 13  --   --  10 8  CREATININE 0.89   < > 0.78 0.85 0.79  CALCIUM 8.7*  --   --  8.4* 8.7*   < > = values in this interval not displayed.   LFT Recent Labs    04/10/20 0459  PROT 6.9  ALBUMIN 3.2*  AST 10*  ALT 10  ALKPHOS 49  BILITOT 0.9   PT/INR No results for input(s): LABPROT, INR in the last 72 hours. Hepatitis Panel No results for input(s): HEPBSAG, HCVAB, HEPAIGM, HEPBIGM in the last 72 hours.  DG CHEST PORT 1 VIEW  Result Date: 04/08/2020 CLINICAL DATA:  COVID-19  positive. EXAM: PORTABLE CHEST 1 VIEW COMPARISON:  None. FINDINGS: Patchy heterogeneous bilateral lung opacities in a basilar predominant distribution. Heart is normal in size with normal mediastinal contours. No pneumothorax or pleural effusion. No acute osseous abnormalities are seen. IMPRESSION: Patchy heterogeneous bilateral lung opacities in a basilar predominant distribution, pattern consistent with COVID-19 pneumonia. Electronically Signed   By: Keith Rake M.D.   On: 04/08/2020 21:26    Principal Problem:   Pancreatitis Active Problems:   Gastritis   Dyslipidemia     LOS: 2 days   Tye Savoy ,NP 04/10/2020, 9:48 AM

## 2020-04-10 NOTE — Discharge Summary (Signed)
Physician Discharge Summary  Darrell Wyatt AVW:[id number] DOB: March 23, 1961 DOA: 04/08/2020  PCP: Jilda Panda, MD  Admit date: 04/08/2020 Discharge date: 04/10/2020  Admitted From: Home Disposition: Home   Recommendations for Outpatient Follow-up:  1. Follow up with PCP in 1-2 weeks 2. Follow up with GI for follow up MRI and office follow up.   Home Health: None Equipment/Devices: None Discharge Condition: Stable CODE STATUS: Full Diet recommendation: Low fat, advance as tolerated  Brief/Interim Summary: Darrell Wyatt is a 59 year old with a history of 4 months of epigastric abdominal pain, HLD, gastritis, and a recent episode of postprocedural pancreatitis. He underwent an endoscopic [place] with FNA of the pancreas June 17 per Dr. Ardis Hughs as part of a work-up for weight loss, new DM, and persistent epigastric abdominal pain.  Abdominal MRI May 10 suggested focal acute pancreatitis of the pancreatic body.  There is no evidence of necrosis or pseudocyst.  After his biopsy procedure he developed significant worsening of abdominal pain and presented to the [facility] ED June 18.  Lipase was elevated at 188 but after analgesics he was able to be discharged home.  Since that time he reports that his epigastric pain has persisted and increased acutely and severely over the 24 hours prior to his return to the ED 6/21. He was admitted for IV fluids and GI consult for recurrent/continued pancreatitis. GI was consulted. Screening covid test was positive, though the pt is fully immunized and had negative testing 3/11 and 6/14. He denies sick contacts. In fact, his only contacts have been vaccinated. CXR did show opacities though he has no respiratory symptoms, or signs of PNA on exam. Typical 10 day isolation is recommended and return precautions were given.   With conservative measures, the patient has tolerated an advancing diet and is cleared for discharge from GI's perspective.   Discharge  Diagnoses:  Principal Problem:   Pancreatitis Active Problems:   Gastritis   Dyslipidemia  Pancreatitis, mild: Idiopathic. s/p EUS with FNA 04/04/2020. No mass, overall anatomy appeared somewhat abnormal, cytology negative for malignancy - Per GI:  --Patient is stable for discharge from GI standpoint.  Spoke with him about diet.  He should advance diet slowly to low fat diet.  Stay on low fat diet for next few weeks.  --Patient will call our office if he has recurrent abdominal pain.  Otherwise, our office will be calling him to arrange for follow up MRI per Dr. Eugenia Pancoast original recommendations following EUS  T2DM: Well controlled. Continue home medications  Elevated d-dimer: No symptoms or signs of DVT/PE. This is in setting of +covid testing.  - LE U/S offered but declined by patient  Discharge Instructions Discharge Instructions    Discharge instructions   Complete by: As directed    You were admitted for pancreatitis related symptoms which have improved. From a GI standpoint you are stable for discharge and will be contacted to schedule follow up with GI. You were also found to test positive for covid-19. You are fortunately not symptomatic at all though there is evidence of pneumonia on your chest xray, but no abnormalities on lung exam and no low oxygen noted on vital signs. The labs have demonstrated elevated markers of inflammation including a possibility of a blood clot, though, again, you do not have symptoms of a blood clot. An ultrasound of the legs was considered though we will defer that for now since you prefer to be discharged quickly.   You are currently stable for discharge, but  should return immediately if you experience leg swelling, chest pain, shortness of breath, abdominal pain worsening, vomiting.   Since you do not have severe covid-19, the CDC recommendation is to remain in isolation for 10 days from the date of your positive test, which was 6/21.     Allergies  as of 04/10/2020   No Known Allergies     Medication List    TAKE these medications   Fiasp FlexTouch 100 UNIT/ML FlexTouch Pen Generic drug: insulin aspart Inject 10 Units into the skin daily as needed (blood glucose over 200).   naproxen sodium 220 MG tablet Commonly known as: ALEVE Take 220 mg by mouth daily as needed (pain).   ondansetron 4 MG tablet Commonly known as: ZOFRAN Take 1 tablet (4 mg total) by mouth every 8 (eight) hours as needed for nausea or vomiting.   oxyCODONE-acetaminophen 5-325 MG tablet Commonly known as: PERCOCET/ROXICET Take 1-2 tablets by mouth every 6 (six) hours as needed for severe pain.   Toujeo Max SoloStar 300 UNIT/ML Solostar Pen Generic drug: insulin glargine (2 Unit Dial) Inject 24 Units into the skin daily.       Follow-up Information    Jilda Panda, MD. Schedule an appointment as soon as possible for a visit in 1 week(s).   Specialty: Internal Medicine Contact information: [phone] [place] [address] [phone]              No Known Allergies  Consultations:  [facility]  Procedures/Studies: DG CHEST PORT 1 VIEW  Result Date: 04/08/2020 CLINICAL DATA:  COVID-19 positive. EXAM: PORTABLE CHEST 1 VIEW COMPARISON:  None. FINDINGS: Patchy heterogeneous bilateral lung opacities in a basilar predominant distribution. Heart is normal in size with normal mediastinal contours. No pneumothorax or pleural effusion. No acute osseous abnormalities are seen. IMPRESSION: Patchy heterogeneous bilateral lung opacities in a basilar predominant distribution, pattern consistent with COVID-19 pneumonia. Electronically Signed   By: Keith Rake M.D.   On: 04/08/2020 21:26     Subjective: Feels well, wants to go home ASAP. Abdominal pain significantly improved despite advancing diet. No cough, runny nose, post nasal drip, sore throat, chest pain, dyspnea, leg swelling, personal or family history of blood clots.   Discharge  Exam: Vitals:   04/10/20 0603 04/10/20 1024  BP: 120/72 (!) 145/107  Pulse: 73 80  Resp:  18  Temp: 99.3 F (37.4 C) 98.4 F (36.9 C)  SpO2: 94% 99%   General: Pt is alert, awake, not in acute distress Cardiovascular: RRR, S1/S2 +, no rubs, no gallops Respiratory: CTA bilaterally, no wheezing, no rhonchi Abdominal: Soft, NT, ND, bowel sounds + Extremities: No edema, no cyanosis  Labs: BNP (last 3 results) No results for input(s): BNP in the last 8760 hours. Basic Metabolic Panel: Recent Labs  Lab 04/05/20 1705 04/08/20 1243 04/08/20 2241 04/09/20 0613 04/10/20 0459  NA 140 139  --  137 138  K 3.6 3.7  --  3.9 3.7  CL 108 105  --  106 107  CO2 22 26  --  22 24  GLUCOSE 173* 172*  --  93 98  BUN 14 13  --  10 8  CREATININE 0.91 0.89 0.78 0.85 0.79  CALCIUM 8.8* 8.7*  --  8.4* 8.7*   Liver Function Tests: Recent Labs  Lab 04/05/20 1705 04/08/20 1243 04/10/20 0459  AST 17 16 10*  ALT 17 14 10   ALKPHOS 54 51 49  BILITOT 0.6 0.7 0.9  PROT 7.3 7.6 6.9  ALBUMIN 3.9 3.7 3.2*   Recent Labs  Lab 04/05/20 1705 04/08/20 1243 04/10/20 0459  LIPASE 188* 106* 60*   No results for input(s): AMMONIA in the last 168 hours. CBC: Recent Labs  Lab 04/05/20 1705 04/08/20 1243 04/08/20 2241 04/09/20 0514 04/10/20 0459  WBC 9.5 8.9 9.5 8.8 9.5  HGB 13.5 13.4 12.6* 12.4* 12.2*  HCT 41.8 41.9 38.8* 38.6* 37.3*  MCV 87.6 85.9 86.4 86.7 85.2  PLT 337 313 317 337 306   Cardiac Enzymes: No results for input(s): CKTOTAL, CKMB, CKMBINDEX, TROPONINI in the last 168 hours. BNP: Invalid input(s): POCBNP CBG: Recent Labs  Lab 04/09/20 1144 04/09/20 1703 04/09/20 2155 04/10/20 0601 04/10/20 0754  GLUCAP 145* 96 97 98 92   D-Dimer Recent Labs    04/08/20 2239 04/10/20 0459  DDIMER 2.54* 4.42*   Hgb A1c Recent Labs    04/08/20 2241  HGBA1C 9.1*   Lipid Profile Recent Labs    04/09/20 0613  CHOL 195  HDL 25*  LDLCALC 147*  TRIG 115  CHOLHDL 7.8    Thyroid function studies No results for input(s): TSH, T4TOTAL, T3FREE, THYROIDAB in the last 72 hours.  Invalid input(s): FREET3 Anemia work up Recent Labs    04/10/20 0459  FERRITIN 147   Urinalysis    Component Value Date/Time   COLORURINE YELLOW 04/08/2020 [place] 04/08/2020 1243   LABSPEC 1.025 04/08/2020 1243   PHURINE 6.0 04/08/2020 1243   GLUCOSEU NEGATIVE 04/08/2020 1243   HGBUR SMALL (A) 04/08/2020 1243   BILIRUBINUR NEGATIVE 04/08/2020 1243   KETONESUR 5 (A) 04/08/2020 1243   PROTEINUR NEGATIVE 04/08/2020 1243   UROBILINOGEN 0.2 08/30/2011 0936   NITRITE NEGATIVE 04/08/2020 1243   LEUKOCYTESUR NEGATIVE 04/08/2020 1243    Microbiology Recent Results (from the past 240 hour(s))  SARS Coronavirus 2 (TAT 6-24 hrs)     Status: None   Collection Time: 04/01/20 12:00 AM  Result Value Ref Range Status   SARS Coronavirus 2 RESULT: NEGATIVE  Final    Comment: RESULT: NEGATIVESARS-CoV-2 INTERPRETATION:A NEGATIVE  test result means that SARS-CoV-2 RNA was not present in the specimen above the limit of detection of this test. This does not preclude a possible SARS-CoV-2 infection and should not be used as the  sole basis for patient management decisions. Negative results must be combined with clinical observations, patient history, and epidemiological information. Optimum specimen types and timing for peak viral levels during infections caused by SARS-CoV-2  have not been determined. Collection of multiple specimens or types of specimens may be necessary to detect virus. Improper specimen collection and handling, sequence variability under primers/probes, or organism present below the limit of detection may  lead to false negative results. Positive and negative predictive values of testing are highly dependent on prevalence. False negative test results are more likely when prevalence of disease is high.The expected result is NEGATIVE.Fact S heet for   Healthcare Providers: LocalChronicle.no Sheet for Patients: SalonLookup.es Reference Range - Negative   SARS Coronavirus 2 by RT PCR (hospital order, performed in [facility] hospital lab) Nasopharyngeal Nasopharyngeal Swab     Status: Abnormal   Collection Time: 04/08/20  6:11 PM   Specimen: Nasopharyngeal Swab  Result Value Ref Range Status   SARS Coronavirus 2 POSITIVE (A) NEGATIVE Final    Comment: RESULT CALLED TO, READ BACK BY AND VERIFIED WITH: HODGES,S. RN @2022  04/08/20 BILLINGSLEY,L (NOTE) SARS-CoV-2 target nucleic acids are DETECTED  SARS-CoV-2 RNA is generally detectable in upper respiratory  specimens  during the acute phase of infection.  Positive results are indicative  of the presence of the identified virus, but do not rule out bacterial infection or co-infection with other pathogens not detected by the test.  Clinical correlation with patient history and  other diagnostic information is necessary to determine patient infection status.  The expected result is negative.  Fact Sheet for Patients:   StrictlyIdeas.no   Fact Sheet for Healthcare Providers:   BankingDealers.co.za    This test is not yet approved or cleared by the [place] FDA and  has been authorized for detection and/or diagnosis of SARS-CoV-2 by FDA under an Emergency Use Authorization (EUA).  This EUA will remain in effect (meaning t his test can be used) for the duration of  the COVID-19 declaration under Section 564(b)(1) of the Act, 21 U.S.C. section 360-bbb-3(b)(1), unless the authorization is terminated or revoked sooner.  Performed at [facility], [place] [address] [place], [place] [address]     Time coordinating discharge: Approximately 40 minutes  Patrecia Pour, MD  [facility] 04/10/2020, 11:00 AM

## 2020-04-10 NOTE — TOC Progression Note (Signed)
Transition of Care [facility]) - Progression Note    Patient Details  Name: Darrell Wyatt MRN: [id number] Date of Birth: 02/22/1961  Transition of [facility]) CM/SW Contact  Purcell Mouton, RN Phone Number: 04/10/2020, 8:49 AM  Clinical Narrative:    TOC will follow for HH/DME needs.    Expected Discharge Plan: Home/Self Care Barriers to Discharge: No Barriers Identified  Expected Discharge Plan and Services Expected Discharge Plan: Home/Self Care   Discharge Planning Services: CM Consult   Living arrangements for the past 2 months: Single Family Home                                       Social Determinants of Health (SDOH) Interventions    Readmission Risk Interventions No flowsheet data found.

## 2020-04-12 ENCOUNTER — Telehealth: Payer: Self-pay

## 2020-04-12 ENCOUNTER — Other Ambulatory Visit: Payer: Self-pay

## 2020-04-12 ENCOUNTER — Other Ambulatory Visit: Payer: Self-pay | Admitting: Gastroenterology

## 2020-04-12 DIAGNOSIS — K859 Acute pancreatitis without necrosis or infection, unspecified: Secondary | ICD-10-CM

## 2020-04-12 NOTE — Telephone Encounter (Addendum)
-----   Message from Willia Craze, NP sent at 04/11/2020  4:51 PM EDT ----- Eustaquio Maize,  Please see Dr. Ardis Hughs comments on EUS path report. He sent message to [facility] about MRI in 2 months followed by appt with him. She may already be working on scheduling it but to answer you question, needs to be done before office appt. Would get the MRI mid Aug which will be just about two months and prior to seeing Dr. Ardis Hughs. Thanks

## 2020-04-12 NOTE — Telephone Encounter (Signed)
Spoke with the patient. He is advised of the follow up appointment. He will expect a call from [facility] for scheduling his MRI a few days prior to his return appointment here. He is planning on repeating his COVID test (had positive in hospital) feeling that he may have had a false positive test.

## 2020-04-15 LAB — SARS CORONAVIRUS 2 (TAT 6-24 HRS): SARS Coronavirus 2: NEGATIVE

## 2020-04-15 NOTE — Anesthesia Postprocedure Evaluation (Signed)
Anesthesia Post Note  Patient: Darrell Wyatt  Procedure(s) Performed: UPPER ENDOSCOPIC ULTRASOUND (EUS) RADIAL (N/A ) FINE NEEDLE ASPIRATION (FNA) LINEAR (N/A ) ESOPHAGOGASTRODUODENOSCOPY (EGD) WITH PROPOFOL (N/A )     Patient location during evaluation: Endoscopy Anesthesia Type: MAC Level of consciousness: awake Pain management: pain level controlled Vital Signs Assessment: post-procedure vital signs reviewed and stable Respiratory status: spontaneous breathing Cardiovascular status: stable Postop Assessment: no apparent nausea or vomiting Anesthetic complications: no   No complications documented.  Last Vitals:  Vitals:   04/04/20 0900 04/04/20 0910  BP: 118/69 (!) 138/91  Pulse: 70 64  Resp: (!) 29 15  Temp:    SpO2: 94% 94%    Last Pain:  Vitals:   04/04/20 0910  TempSrc:   PainSc: 0-No pain                 Huston Foley

## 2020-06-03 ENCOUNTER — Other Ambulatory Visit: Payer: 59

## 2020-06-03 ENCOUNTER — Ambulatory Visit
Admission: RE | Admit: 2020-06-03 | Discharge: 2020-06-03 | Disposition: A | Discharge status: Home / Self Care | Payer: 59 | Source: Ambulatory Visit | Attending: Gastroenterology | Admitting: Gastroenterology

## 2020-06-03 DIAGNOSIS — K859 Acute pancreatitis without necrosis or infection, unspecified: Secondary | ICD-10-CM

## 2020-06-03 IMAGING — MR MR ABDOMEN WO/W CM
22 of 23 series · 46 of 48 positions shown · IV contrast (multihance)
Comparison: [DATE]

CLINICAL DATA: Follow-up of probable pancreatitis. Persistent
elevated lipase. Abdominal pain.

EXAM:
MRI ABDOMEN WITHOUT AND WITH CONTRAST
TECHNIQUE: Multiplanar multisequence MR imaging of the abdomen was performed
both before and after the administration of intravenous contrast.
CONTRAST:  15mL MULTIHANCE GADOBENATE DIMEGLUMINE 529 MG/ML IV SOLN

[Series 3: T2 · coronal · 4.5mm · 1.56mm/px · 1 of 42 slices shown (1 of 5)]
[im 1/42]
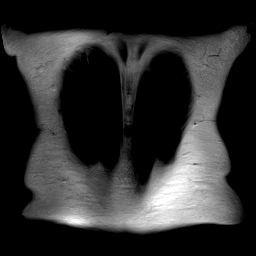

[Series 4: T2 · coronal · 3.0mm · 1.19mm/px · 1 of 4 slices shown (2 of 5)]
[im 1/4]
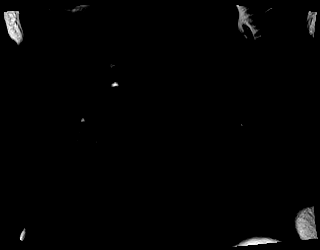

[Series 5: T1 · axial · 3.0mm · 1.19mm/px · z∈[-201,+36]mm · 4 of 160 slices shown]
[im 1/160]
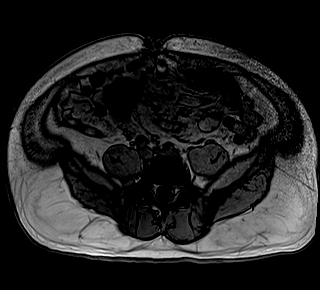
[im 54/160]
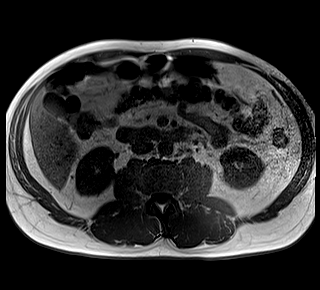
[im 107/160]
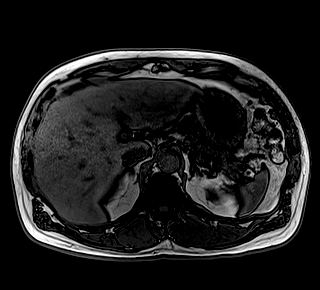
[im 160/160]
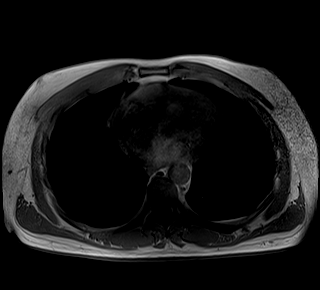

[Series 6: T2 · axial · 6.0mm · 1.19mm/px · 1 of 33 slices shown (3 of 5)]
[im 1/33]
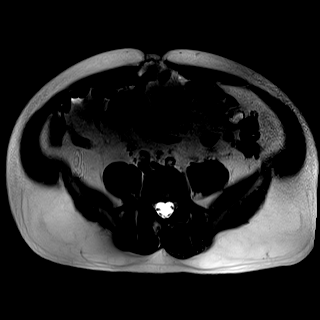

[Series 7: bSSFP · axial · 5.0mm · 1.25mm/px · 1 of 38 slices shown (1 of 2)]
[im 1/38]
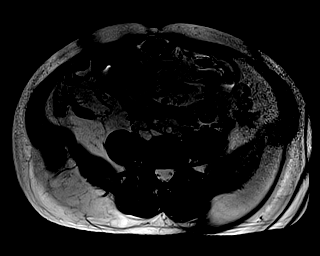

[Series 9: bSSFP · coronal · 5.0mm · 1.25mm/px · 1 of 36 slices shown (2 of 2)]
[im 1/36]
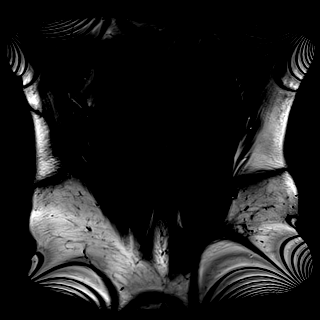

[Series 12: T2 · coronal · 3.0mm · 1.19mm/px · 1 of 54 slices shown (4 of 5)]
[im 1/54]
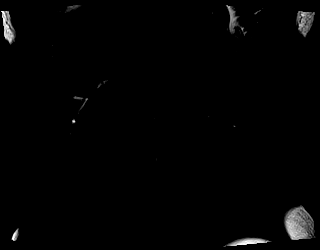

[Series 13: T2 · axial · 5.0mm · 1.56mm/px · 1 of 44 slices shown (5 of 5)]
[im 1/44]
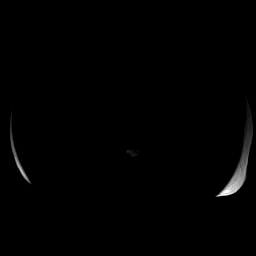

[Series 14: MRCP · coronal · 1.0mm · 0.49mm/px · 1 of 64 slices shown]
[im 1/64]
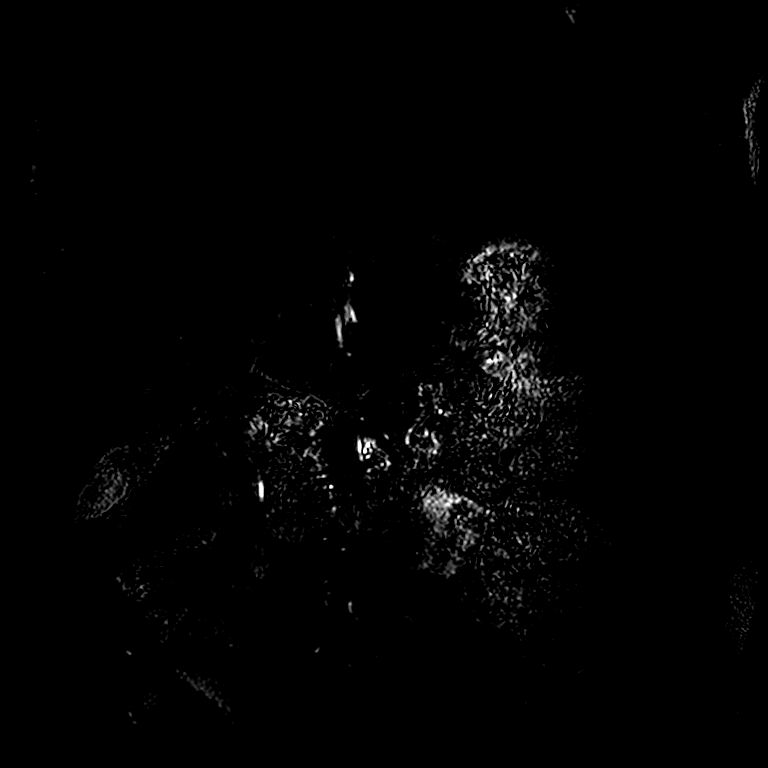

[Series 17: DWI · 1 of 4 slices shown (1 of 3)]
[im 1/4]
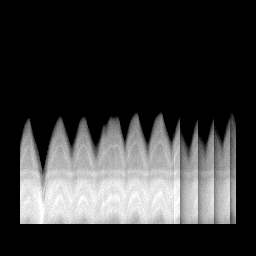

[Series 18: DWI · axial · 5.0mm · 1.42mm/px · z∈[-185,+73]mm · 3 of 132 slices shown (2 of 3)]
[im 1/132]
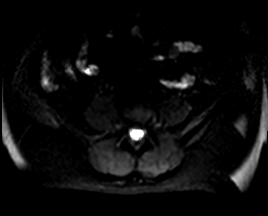
[im 66/132]
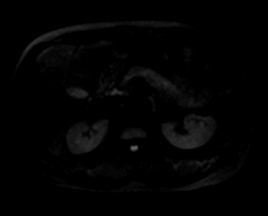
[im 132/132]
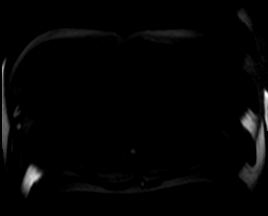

[Series 19: DWI · axial · 5.0mm · 1.42mm/px · 1 of 44 slices shown (3 of 3)]
[im 1/44]
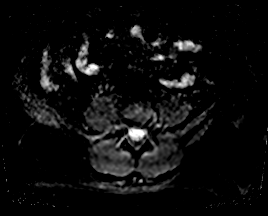

[Series 20: T1 dynamic · axial · non-contrast · 3.0mm · 1.25mm/px · z∈[-202,+35]mm · 3 of 80 slices shown]
[im 1/80]
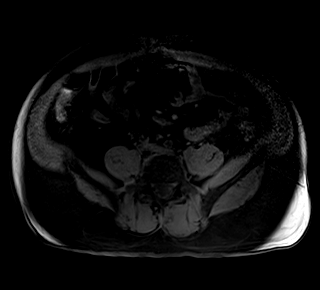
[im 40/80]
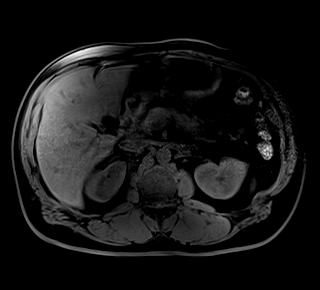
[im 80/80]
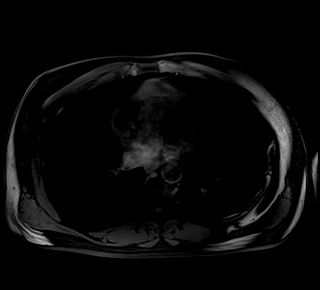

[Series 21: T1 dynamic post-contrast · axial · 3.0mm · 1.25mm/px · z∈[-202,+35]mm · 3 of 80 slices shown (1 of 9)]
[im 1/80]
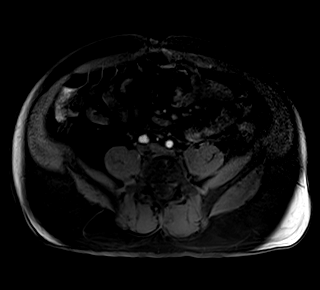
[im 40/80]
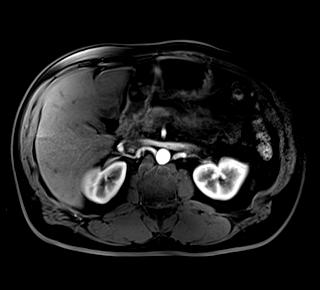
[im 80/80]
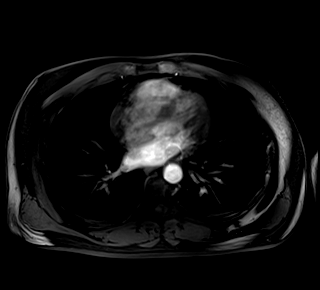

[Series 22: T1 dynamic post-contrast · axial · 3.0mm · 1.25mm/px · z∈[-202,+35]mm · 3 of 80 slices shown (2 of 9)]
[im 1/80]
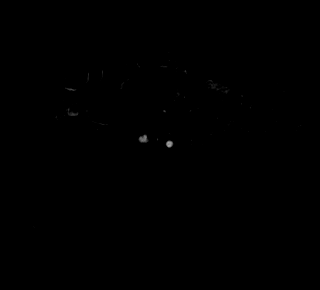
[im 40/80]
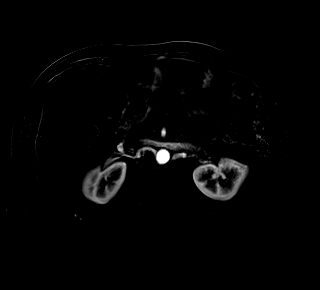
[im 80/80]
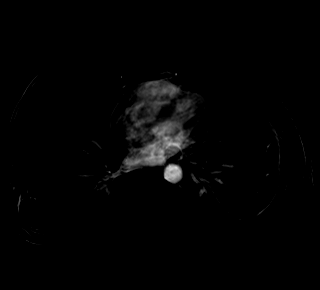

[Series 23: T1 dynamic post-contrast · axial · 3.0mm · 1.25mm/px · z∈[-202,+35]mm · 3 of 80 slices shown (3 of 9)]
[im 1/80]
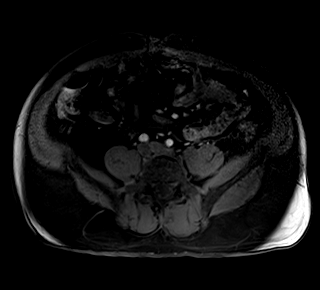
[im 40/80]
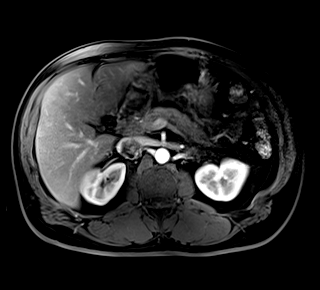
[im 80/80]
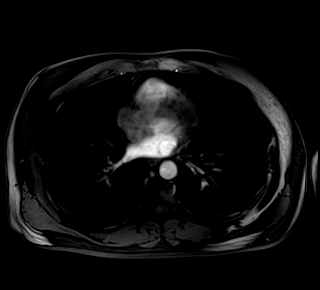

[Series 24: T1 dynamic post-contrast · axial · 3.0mm · 1.25mm/px · z∈[-202,+35]mm · 3 of 80 slices shown (4 of 9)]
[im 1/80]
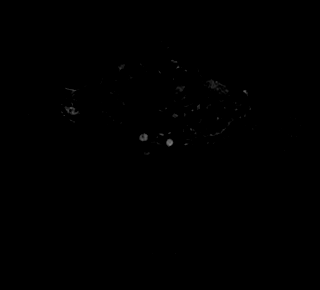
[im 40/80]
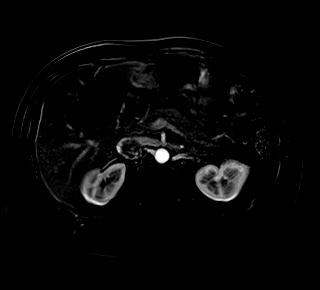
[im 80/80]
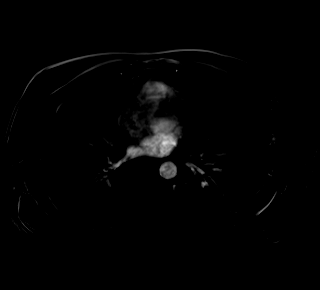

[Series 25: T1 dynamic post-contrast · axial · 3.0mm · 1.25mm/px · z∈[-202,+35]mm · 3 of 80 slices shown (5 of 9)]
[im 1/80]
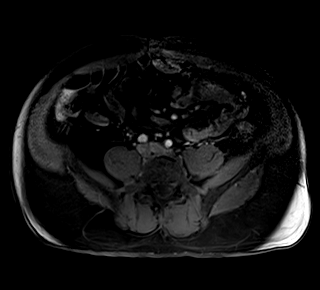
[im 40/80]
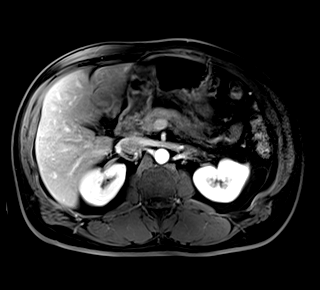
[im 80/80]
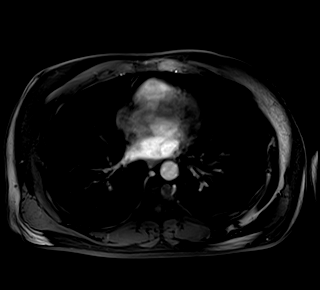

[Series 26: T1 dynamic post-contrast · axial · 3.0mm · 1.25mm/px · z∈[-202,+35]mm · 3 of 80 slices shown (6 of 9)]
[im 1/80]
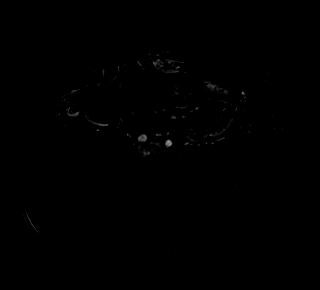
[im 40/80]
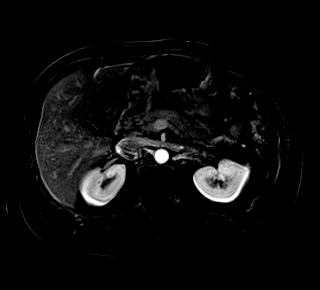
[im 80/80]
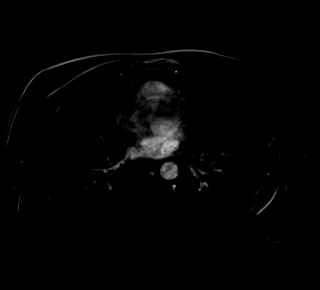

[Series 27: T1 dynamic post-contrast · coronal · 3.0mm · 1.25mm/px · 3 of 80 slices shown (7 of 9)]
[im 1/80]
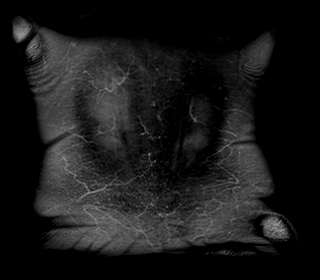
[im 40/80]
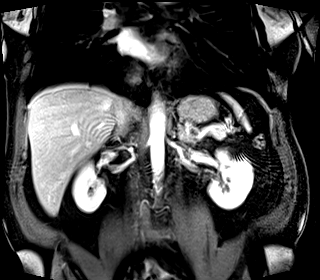
[im 80/80]
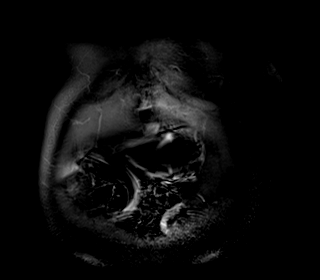

[Series 28: T1 dynamic post-contrast · axial · 3.0mm · 1.25mm/px · z∈[-202,+35]mm · 3 of 80 slices shown (8 of 9)]
[im 1/80]
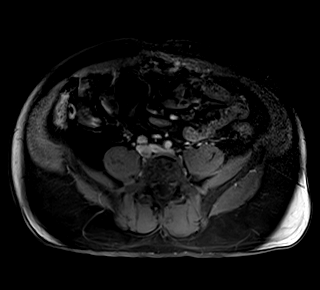
[im 40/80]
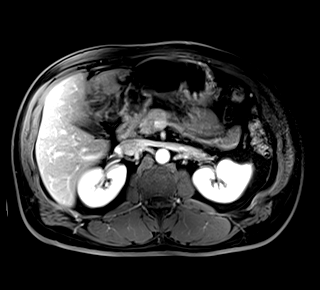
[im 80/80]
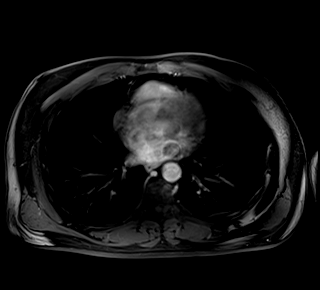

[Series 29: T1 dynamic post-contrast · axial · 3.0mm · 1.25mm/px · z∈[-202,-85]mm · 2 of 80 slices shown (9 of 9)]
[im 1/80]
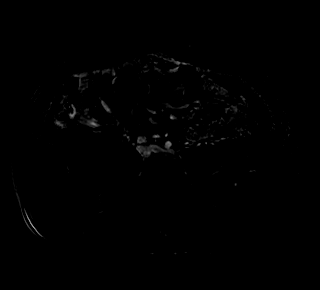
[im 40/80]
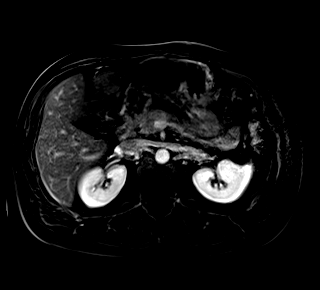

[46 of 48 positions shown; findings below may reference images not displayed]

FINDINGS: Lower chest: Normal heart size without pericardial or pleural
effusion.

Hepatobiliary: Tiny T2 hyperintense liver lesions are likely cysts
or bile duct hamartomas. No suspicious liver mass.

Normal gallbladder, without biliary ductal dilatation. No
choledocholithiasis.

Pancreas: Overall improved appearance of the pancreas since the
prior MRI. Suspicion of mild residual peripancreatic edema involving
the body and tail, including on 21/13. Although there is no
pancreatic duct dilatation, on dedicated MRCP images, there is an
area of apparent ductal narrowing possibly related to edema. Example
13/14 and series 15. Interval development of a presumed pancreatic
pseudocyst exophytic off the dorsal pancreatic body/tail junction at
1.1 cm on [DATE]. No pancreatic necrosis. No pancreas divisum.

Spleen:  Normal in size, without focal abnormality.

Adrenals/Urinary Tract: Normal adrenal glands. A 5 mm interpolar
right renal hemorrhagic cyst on 49/20 and 49/26. Normal left kidney.
No hydronephrosis.

Stomach/Bowel: Normal stomach. Scattered colonic diverticula. Normal
small bowel.

Vascular/Lymphatic: Aortic atherosclerosis. Patent splenic and
portal veins. No abdominal adenopathy.

Other:  No ascites.

Musculoskeletal: No acute osseous abnormality.
IMPRESSION: 1. Improved appearance of the pancreas compared to [DATE],
without evidence of underlying carcinoma.
2. Cannot exclude residual or recurrent mild peripancreatic edema
about the tail and body. A new 1.1 cm cystic lesion off the dorsal
pancreatic body/tail junction is presumably a pseudocyst.
3.  Aortic Atherosclerosis ([CZ]-[CZ]).

## 2020-06-03 MED ORDER — GADOBENATE DIMEGLUMINE 529 MG/ML IV SOLN
15.0000 mL | Freq: Once | INTRAVENOUS | Status: AC | PRN
  Administered 2020-06-03: 15 mL via INTRAVENOUS

## 2020-06-10 ENCOUNTER — Ambulatory Visit: Payer: 59 | Admitting: Gastroenterology

## 2020-07-17 ENCOUNTER — Ambulatory Visit: Payer: 59 | Admitting: Physician Assistant

## 2020-08-15 ENCOUNTER — Encounter: Payer: Self-pay | Admitting: Physician Assistant

## 2020-08-15 ENCOUNTER — Ambulatory Visit (INDEPENDENT_AMBULATORY_CARE_PROVIDER_SITE_OTHER): Payer: 59 | Admitting: Physician Assistant

## 2020-08-15 VITALS — BP 144/84 | HR 59 | Ht 71.0 in | Wt 209.0 lb

## 2020-08-15 DIAGNOSIS — K861 Other chronic pancreatitis: Secondary | ICD-10-CM | POA: Diagnosis not present

## 2020-08-15 MED ORDER — PANCRELIPASE (LIP-PROT-AMYL) 36000-114000 UNITS PO CPEP: ORAL_CAPSULE | ORAL | 3 refills | Status: AC

## 2020-08-15 MED ORDER — TRAMADOL HCL 50 MG PO TABS: 50.0000 mg | ORAL_TABLET | Freq: Four times a day (QID) | ORAL | 1 refills | Status: AC | PRN

## 2020-08-15 NOTE — Patient Instructions (Signed)
We have given you samples of Creon to take 2 capsules with meals and 1 capsule with snacks  We have sent a prescription of Creon and Tramadol to your pharmacy  Follow up with Dr Ardis Hughs in 3 months  If you are age 57 or older, your body mass index should be between 23-30. Your Body mass index is 29.15 kg/m. If this is out of the aforementioned range listed, please consider follow up with your Primary Care Provider.  If you are age 82 or younger, your body mass index should be between 19-25. Your Body mass index is 29.15 kg/m. If this is out of the aformentioned range listed, please consider follow up with your Primary Care Provider.    I appreciate the  opportunity to care for you  Thank You   Darrell Wyatt

## 2020-08-15 NOTE — Progress Notes (Signed)
Chief Complaint: Follow-up pancreatitis  HPI:    Darrell Wyatt is a 59 year old African-American male with a past medical history as listed below, known to Darrell Wyatt, who was referred to me by Darrell Panda, MD for follow-up of pancreatitis.      04/09/2020 patient consulted by our service in the hospital for mild pancreatitis.  It was noted he was status post EUS with FNA 04/04/2020, no mass was seen but overall anatomy appeared somewhat abnormal, cytology was negative for malignancy.  He received a one-time dose of Cipro and Flagyl the day before, patient was nontender on exam.  He was Covid positive during that hospitalization but asymptomatic.  Patient was discharged the next day he was told to advance his diet slowly to a low-fat diet and stay on low-fat for the next few weeks.    06/03/2020 MRI of the abdomen with and without contrast showed improved appearance of the pancreas compared to 02/26/2020 without evidence of underlying carcinoma.  Could not exclude residual or recurrent mild peripancreatic edema about the tail and body.  Any 1.1 cm cystic lesion off the dorsal pancreatic body/tail junction with presumably a pseudocyst.  Darrell Wyatt reviewed and expressed that his pancreas overall looked better by MRI.  It was hoped that he was better.    Today, the patient presents to clinic and tells me he continues to have some epigastric pain that will radiate through to his back depending on what he eats.  Over the weekend he had a sausage hotdog and the next day he felt this pain from the front through to his back as well as some nausea.  This abated after taking some Advil.  He tells me that just seems to come and go and will radiate through to his back, often related to what he is eating.  Tells me that the Advil does not always help with the pain and sometimes it last for hours.    Does express his concern over being treated as a "Covid patient" over at the hospital when he took a test after getting out  and it was negative.  Tells me this is a false positive and they were very extreme about it in the hospital.    Denies fever, chills or weight loss.  PREVIOUS ENDOSCOPIC EVALUATIONS / GI STUDIES :  12/01/19 CT scan  abd/ pelvis with contrast --Fatty infiltration of the liver, left colonic diverticulosis, no acute findings  01/01/2020 EGD --Mild gastritis  01/01/2020 colonoscopy --Complete exam, good bowel prep --Two 2 to 3 mm polyps in the descending colon and in the transverse colon, removed with a cold snare. Resected and retrieved. - Diverticulosis in the left colon. - The examination was otherwise normal on direct and retroflexion views  Surgical [P], colon, descending and transverse, polyp (2) - TUBULAR ADENOMA, NEGATIVE FOR HIGH GRADE DYSPLASIA (X2). 2. Surgical [P], gastric antrum and gastric body - MILDLY ACTIVE CHRONIC GASTRITIS, SEE COMMENT. No H. Pylori  02/26/20 MRI  --probable focal acute pancreatitis involving the pancreatic body, neoplasm considered less likely.  04/04/20  EUS The overall anatomy of the pancreas was somewhat abnormal, with subtle 'partitioning' of the pancreas along vascular planes of splenic artery, vein (anatomic variant?). The main pancreatic duct was normal throughout it's course in the uncinate, head, and body however it was dilated to 3.43mm in the tail. There was not an obvious mass at the site of ductal caliber change however given the presenting symptoms I sampled the pancreatic parenchyma at the  site with two transgastric passes with a 22 gauge EUS FNA needle. Final cytology results are pending. - A few hyperechoic strands and foci throughout the pancreas.  Cytology --Clinical History: Abnormal pancreas  Specimen Submitted: A. PANCREAS, BODY, TAIL, FINE NEEDLE ASPIRATION:   FINAL MICROSCOPIC DIAGNOSIS:  - No malignant cells identified  - Benign glandular and pancreatic cells.    Past Medical History:  Diagnosis Date  .  Chronic gastritis   . Diverticulosis of colon    left side  . Hyperlipidemia   . Tubular adenoma of colon 12/2019    Past Surgical History:  Procedure Laterality Date  . COLONOSCOPY N/A 09/06/2014   Procedure: COLONOSCOPY;  Surgeon: [name], MD;  Location: [facility];  Service: Endoscopy;  Laterality: N/A;  . ESOPHAGOGASTRODUODENOSCOPY    . ESOPHAGOGASTRODUODENOSCOPY (EGD) WITH PROPOFOL N/A 04/04/2020   Procedure: ESOPHAGOGASTRODUODENOSCOPY (EGD) WITH PROPOFOL;  Surgeon: [name], MD;  Location: [facility];  Service: Endoscopy;  Laterality: N/A;  . EUS N/A 04/04/2020   Procedure: UPPER ENDOSCOPIC ULTRASOUND (EUS) RADIAL;  Surgeon: [name], MD;  Location: [facility];  Service: Endoscopy;  Laterality: N/A;  . FINE NEEDLE ASPIRATION N/A 04/04/2020   Procedure: FINE NEEDLE ASPIRATION (FNA) LINEAR;  Surgeon: [name], MD;  Location: [facility];  Service: Endoscopy;  Laterality: N/A;  . KNEE ARTHROSCOPY      Current Outpatient Medications  Medication Sig Dispense Refill  . insulin aspart (FIASP FLEXTOUCH) 100 UNIT/ML FlexTouch Pen Inject 10 Units into the skin daily as needed (blood glucose over 200).    . insulin glargine, 2 Unit Dial, (TOUJEO MAX SOLOSTAR) 300 UNIT/ML Solostar Pen Inject 24 Units into the skin daily.     . naproxen sodium (ALEVE) 220 MG tablet Take 220 mg by mouth daily as needed (pain).    . ondansetron (ZOFRAN) 4 MG tablet Take 1 tablet (4 mg total) by mouth every 8 (eight) hours as needed for nausea or vomiting. 15 tablet 0  . oxyCODONE-acetaminophen (PERCOCET/ROXICET) 5-325 MG tablet Take 1-2 tablets by mouth every 6 (six) hours as needed for severe pain. 10 tablet 0   No current facility-administered medications for this visit.    Allergies as of 08/15/2020  . (No Known Allergies)    Family History  Problem Relation Age of Onset  . Diverticulitis Mother   . Diverticulitis Sister   . Colon cancer Neg Hx   . Esophageal  cancer Neg Hx   . Rectal cancer Neg Hx   . Stomach cancer Neg Hx     Social History   Socioeconomic History  . Marital status: Married    Spouse name: Not on file  . Number of children: 5  . Years of education: Not on file  . Highest education level: Not on file  Occupational History  . Occupation: self employed  Tobacco Use  . Smoking status: Current Every Day Smoker    Packs/day: 0.50    Types: Cigarettes  . Smokeless tobacco: Never Used  Vaping Use  . Vaping Use: Never used  Substance and Sexual Activity  . Alcohol use: Yes    Comment: very rarely  . Drug use: No  . Sexual activity: Not on file  Other Topics Concern  . Not on file  Social History Narrative  . Not on file   Social Determinants of Health   Financial Resource Strain:   . Difficulty of Paying Living Expenses: Not on file  Food Insecurity:   . Worried About  Running Out of Food in the Last Year: Not on file  . Ran Out of Food in the Last Year: Not on file  Transportation Needs:   . Lack of Transportation (Medical): Not on file  . Lack of Transportation (Non-Medical): Not on file  Physical Activity:   . Days of Exercise per Week: Not on file  . Minutes of Exercise per Session: Not on file  Stress:   . Feeling of Stress : Not on file  Social Connections:   . Frequency of Communication with Friends and Family: Not on file  . Frequency of Social Gatherings with Friends and Family: Not on file  . Attends Religious Services: Not on file  . Active Member of Clubs or Organizations: Not on file  . Attends Archivist Meetings: Not on file  . Marital Status: Not on file  Intimate Partner Violence:   . Fear of Current or Ex-Partner: Not on file  . Emotionally Abused: Not on file  . Physically Abused: Not on file  . Sexually Abused: Not on file    Review of Systems:    Constitutional: No weight loss, fever or chills Cardiovascular: No chest pain Respiratory: No SOB  Gastrointestinal: See HPI  and otherwise negative   Physical Exam:  Vital signs: BP (!) 144/84   Pulse (!) 59   Ht 5\' 11"  (1.803 m)   Wt 209 lb (94.8 kg)   BMI 29.15 kg/m   Constitutional:   Pleasant AA male appears to be in NAD, Well developed, Well nourished, alert and cooperative Respiratory: Respirations even and unlabored. Lungs clear to auscultation bilaterally.   No wheezes, crackles, or rhonchi.  Cardiovascular: Normal S1, S2. No MRG. Regular rate and rhythm. No peripheral edema, cyanosis or pallor.  Gastrointestinal:  Soft, nondistended, nontender. No rebound or guarding. Normal bowel sounds. No appreciable masses or hepatomegaly. Rectal:  Not performed.  Psychiatric: Demonstrates good judgement and reason without abnormal affect or behaviors.  RELEVANT LABS AND IMAGING: CBC    Component Value Date/Time   WBC 9.5 04/10/2020 0459   RBC 4.38 04/10/2020 0459   HGB 12.2 (L) 04/10/2020 0459   HCT 37.3 (L) 04/10/2020 0459   PLT 306 04/10/2020 0459   MCV 85.2 04/10/2020 0459   MCH 27.9 04/10/2020 0459   MCHC 32.7 04/10/2020 0459   RDW 14.8 04/10/2020 0459   LYMPHSABS 3.6 01/22/2020 1128   MONOABS 0.6 01/22/2020 1128   EOSABS 0.2 01/22/2020 1128   BASOSABS 0.0 01/22/2020 1128    CMP     Component Value Date/Time   NA 138 04/10/2020 0459   K 3.7 04/10/2020 0459   CL 107 04/10/2020 0459   CO2 24 04/10/2020 0459   GLUCOSE 98 04/10/2020 0459   BUN 8 04/10/2020 0459   CREATININE 0.79 04/10/2020 0459   CALCIUM 8.7 (L) 04/10/2020 0459   PROT 6.9 04/10/2020 0459   ALBUMIN 3.2 (L) 04/10/2020 0459   AST 10 (L) 04/10/2020 0459   ALT 10 04/10/2020 0459   ALKPHOS 49 04/10/2020 0459   BILITOT 0.9 04/10/2020 0459   GFRNONAA >60 04/10/2020 0459   GFRAA >60 04/10/2020 0459    Assessment: 1.  Chronic pancreatitis: Patient continues with episodes of epigastric pain off and on, extensive work-up as listed in HPI, uncertain etiology  Plan: 1.  Discussed pancreatitis with the patient.  I feel he may  benefit from some enzyme supplement.  Start him on Creon 36,000 units 2 with a meal and 1 with a snack.  Discussed that if this is too expensive for him he should let us know and we can set him up with a nurse navigator for Creon to help him get this lower cost. 2.  Also prescribed Tramadol 50 mg, 1 tab every 4-6 hours as needed for pain.  #15 with 1 refill. 3.  Patient to follow in clinic with Darrell Wyatt in 3 months or sooner if necessary.  Ellouise Newer, PA-C [facility] 08/15/2020, 10:50 AM  Cc: Darrell Panda, MD

## 2020-08-16 NOTE — Progress Notes (Signed)
I agree with the above note, plan 

## 2021-08-08 ENCOUNTER — Ambulatory Visit: Payer: 59 | Admitting: Registered"

## 2021-09-29 ENCOUNTER — Encounter: Payer: 59 | Attending: Internal Medicine | Admitting: Registered"

## 2021-09-29 ENCOUNTER — Other Ambulatory Visit: Payer: Self-pay

## 2021-09-29 ENCOUNTER — Encounter: Payer: Self-pay | Admitting: Registered"

## 2021-09-29 DIAGNOSIS — E119 Type 2 diabetes mellitus without complications: Secondary | ICD-10-CM | POA: Insufficient documentation

## 2021-09-29 NOTE — Patient Instructions (Addendum)
Find out what your A1c is.  Learn which food fall into the carbohydrate category and pay attention to how much you are eating of them. Start checking your blood sugar regularly, Fasting and 2 hours after meals Pogo is a new option for checking blood sugar (cylinder of 10 needles and strips) Can call your insurance company to see if they cover. Consider cutting out the sweet tea Schedule a dilated eye exam soon.

## 2021-09-29 NOTE — Progress Notes (Signed)
Diabetes Self-Management Education  Visit Type: First/Initial  Appt. Start Time: 0930 Appt. End Time: 1050  09/30/2021  [name], identified by name and date of birth, is a 60 y.o. male with a diagnosis of Diabetes: Type 2.   ASSESSMENT  There were no vitals taken for this visit. There is no height or weight on file to calculate BMI.  Labs from paper referral (collection date not clear) A1c: 11.7% Lipid panel: TC 236, LDL 104, TG? 490; HDL? 29 (lab values handwritten on referral) Medication: Insulin aspart sliding scale; Insulin Glargine 24 units daily  PMH: pancreatitis, diverticulitis  Pt states he doesn't understand why he has 2 different insulin pens. Pt reports this morning he injected 20 units after checking his blood sugar because it was 183 mg/dL. Pt states he had coffee with 1 tsp sugar ~20 min before checking blood sugar.   Pt states he doesn't like needles and doesn't check blood sugar regularly. Pt reports he uses single use lancets.  Pt states he feels good, up early always doing something, although no structured exercise, stays active.  Sample: One Touch Lancing device, Lot [id number], Exp 04/2025 Glucose log book   Diabetes Self-Management Education - 09/29/21 0948       Visit Information   Visit Type First/Initial      Initial Visit   Diabetes Type Type 2    Are you currently following a meal plan? No    Are you taking your medications as prescribed? No   doesn't understand   Date Diagnosed 2020 when in [place] for another condition      Health Coping   How would you rate your overall health? Good      Psychosocial Assessment   Patient Belief/Attitude about Diabetes Denial    How often do you need to have someone help you when you read instructions, pamphlets, or other written materials from your doctor or pharmacy? 1 - Never    What is the last grade level you completed in school? 12 and some college      Complications   Last HgB A1C per  patient/outside source 11.7 %   referral notes   Fasting Blood glucose range (mg/dL) 180-200    Have you had a dilated eye exam in the past 12 months? No    Have you had a dental exam in the past 12 months? No    Are you checking your feet? Yes    How many days per week are you checking your feet? 3      Dietary Intake   Breakfast none 1-2x/week biscuit    Lunch 1:30 streak, gravy onion, rice, collard greens, biscuits    Snack (afternoon) honey    Dinner left overs    Snack (evening) grapes    Beverage(s) water, coffee with 1 tsp sugar, occasional alcohol, not a lot of soda, sweet tea,      Exercise   Exercise Type ADL's      Patient Education   Previous Diabetes Education No    Disease state  Definition of diabetes, type 1 and 2, and the diagnosis of diabetes    Nutrition management  Role of diet in the treatment of diabetes and the relationship between the three main macronutrients and blood glucose level    Medications Reviewed patients medication for diabetes, action, purpose, timing of dose and side effects.    Monitoring Taught/evaluated SMBG meter.;Purpose and frequency of SMBG.;Interpreting lab values - A1C, lipid, urine microalbumina.  Chronic complications Relationship between chronic complications and blood glucose control;Retinopathy and reason for yearly dilated eye exams      Individualized Goals (developed by patient)   Nutrition General guidelines for healthy choices and portions discussed    Medications take my medication as prescribed    Monitoring  test my blood glucose as discussed      Outcomes   Expected Outcomes Demonstrated interest in learning. Expect positive outcomes    Future DMSE 4-6 wks    Program Status Not Completed             Individualized Plan for Diabetes Self-Management Training:   Learning Objective:  Patient will have a greater understanding of diabetes self-management. Patient education plan is to attend individual and/or group  sessions per assessed needs and concerns.    Patient Instructions  Find out what your A1c is.  Learn which food fall into the carbohydrate category and pay attention to how much you are eating of them. Start checking your blood sugar regularly, Fasting and 2 hours after meals Pogo is a new option for checking blood sugar (cylinder of 10 needles and strips) Can call your insurance company to see if they cover. Consider cutting out the sweet tea Schedule a dilated eye exam soon.  Expected Outcomes:  Demonstrated interest in learning. Expect positive outcomes  Education material provided: ADA - How to Thrive: A Guide for Your Journey with Diabetes, A1C conversion sheet, My Plate, and Carbohydrate counting sheet  If problems or questions, patient to contact team via:  Phone and MyChart  Future DSME appointment: 4-6 wks

## 2021-09-30 DIAGNOSIS — E119 Type 2 diabetes mellitus without complications: Secondary | ICD-10-CM | POA: Insufficient documentation

## 2021-12-08 ENCOUNTER — Ambulatory Visit: Payer: 59 | Admitting: Registered"

## 2022-01-23 ENCOUNTER — Ambulatory Visit: Payer: 59 | Admitting: Registered"

## 2023-10-12 ENCOUNTER — Other Ambulatory Visit: Payer: Self-pay

## 2023-10-12 ENCOUNTER — Emergency Department (HOSPITAL_COMMUNITY)
Admission: EM | Admit: 2023-10-12 | Discharge: 2023-10-12 | Disposition: A | Discharge status: Home / Self Care | Payer: 59 | Attending: Emergency Medicine | Admitting: Emergency Medicine

## 2023-10-12 ENCOUNTER — Emergency Department (HOSPITAL_COMMUNITY): Payer: 59

## 2023-10-12 DIAGNOSIS — K2950 Unspecified chronic gastritis without bleeding: Secondary | ICD-10-CM | POA: Diagnosis not present

## 2023-10-12 DIAGNOSIS — Z794 Long term (current) use of insulin: Secondary | ICD-10-CM | POA: Insufficient documentation

## 2023-10-12 DIAGNOSIS — R1011 Right upper quadrant pain: Secondary | ICD-10-CM | POA: Diagnosis present

## 2023-10-12 LAB — URINALYSIS, ROUTINE W REFLEX MICROSCOPIC
Bilirubin Urine: NEGATIVE
Glucose, UA: NEGATIVE mg/dL
Hgb urine dipstick: NEGATIVE
Ketones, ur: NEGATIVE mg/dL
Leukocytes,Ua: NEGATIVE
Nitrite: NEGATIVE
Protein, ur: NEGATIVE mg/dL
Specific Gravity, Urine: 1.021 (ref 1.005–1.030)
pH: 5 (ref 5.0–8.0)

## 2023-10-12 LAB — COMPREHENSIVE METABOLIC PANEL
ALT: 16 U/L (ref 0–44)
AST: 17 U/L (ref 15–41)
Albumin: 4 g/dL (ref 3.5–5.0)
Alkaline Phosphatase: 50 U/L (ref 38–126)
Anion gap: 9 (ref 5–15)
BUN: 15 mg/dL (ref 6–20)
CO2: 24 mmol/L (ref 22–32)
Calcium: 9.2 mg/dL (ref 8.9–10.3)
Chloride: 108 mmol/L (ref 98–111)
Creatinine, Ser: 0.71 mg/dL (ref 0.61–1.24)
GFR, Estimated: >60 mL/min (ref >60)
Glucose, Bld: 117 mg/dL — ABNORMAL HIGH (ref 70–99)
Potassium: 3.7 mmol/L (ref 3.5–5.1)
Sodium: 141 mmol/L (ref 135–145)
Total Bilirubin: 0.7 mg/dL (ref <1.2)
Total Protein: 7.7 g/dL (ref 6.5–8.1)

## 2023-10-12 LAB — CBC
HCT: 44 % (ref 39.0–52.0)
Hemoglobin: 14.1 g/dL (ref 13.0–17.0)
MCH: 27.5 pg (ref 26.0–34.0)
MCHC: 32 g/dL (ref 30.0–36.0)
MCV: 85.8 fL (ref 80.0–100.0)
Platelets: 331 10*3/uL (ref 150–400)
RBC: 5.13 MIL/uL (ref 4.22–5.81)
RDW: 14.4 % (ref 11.5–15.5)
WBC: 7.8 10*3/uL (ref 4.0–10.5)
nRBC: 0 % (ref 0.0–0.2)

## 2023-10-12 LAB — LIPASE, BLOOD: Lipase: 26 U/L (ref 11–51)

## 2023-10-12 MED ORDER — MORPHINE SULFATE (PF) 4 MG/ML IV SOLN
4.0000 mg | Freq: Once | INTRAVENOUS | Status: AC
  Administered 2023-10-12: 4 mg via INTRAVENOUS
  Filled 2023-10-12: qty 1

## 2023-10-12 MED ORDER — PANTOPRAZOLE SODIUM 40 MG PO TBEC: 40.0000 mg | DELAYED_RELEASE_TABLET | Freq: Every day | ORAL | 0 refills | Status: AC

## 2023-10-12 MED ORDER — ALUM & MAG HYDROXIDE-SIMETH 200-200-20 MG/5ML PO SUSP
30.0000 mL | Freq: Once | ORAL | Status: AC
  Administered 2023-10-12: 30 mL via ORAL
  Filled 2023-10-12: qty 30

## 2023-10-12 MED ORDER — SUCRALFATE 1 G PO TABS: 1.0000 g | ORAL_TABLET | Freq: Three times a day (TID) | ORAL | 0 refills | Status: AC

## 2023-10-12 MED ORDER — LIDOCAINE VISCOUS HCL 2 % MT SOLN
15.0000 mL | Freq: Once | OROMUCOSAL | Status: AC
  Administered 2023-10-12: 15 mL via ORAL
  Filled 2023-10-12: qty 15

## 2023-10-12 NOTE — ED Provider Notes (Signed)
[place] EMERGENCY DEPARTMENT AT [facility] Provider Note   CSN: [id number] Arrival date & time: 10/12/23  0756     History  Chief Complaint  Patient presents with   Abdominal Pain    Darrell Wyatt is a 63 y.o. male with past medical history seen for hyperlipidemia, gastritis, pancreatitis presents with concern for epigastric to right upper quadrant abdominal pain that is worse after eating for the last several days.  Patient reports that he has not had significant nausea with the pain.  He denies any fever, chills.  He reports that he was seen in urgent care yesterday but they could not do anything for his pain.  Patient reports admitted in the past for pancreatitis and this feels somewhat similar.  He denies alcohol use, he reports that he still has his gallbladder.  No history of gallstones or other acute gallbladder pathology.  Did not try anything for pain prior to arrival.  Patient reports that he has had a normal appetite despite the abdominal pain but pain is worse after eating.   Abdominal Pain      Home Medications Prior to Admission medications   Medication Sig Start Date End Date Taking? Authorizing Provider  pantoprazole (PROTONIX) 40 MG tablet Take 1 tablet (40 mg total) by mouth daily. Take 30 mins prior to first meal of day 10/12/23  Yes Zamani Crocker H, PA-C  sucralfate (CARAFATE) 1 g tablet Take 1 tablet (1 g total) by mouth 4 (four) times daily -  with meals and at bedtime. 10/12/23  Yes Zayaan Kozak H, PA-C  insulin aspart (FIASP FLEXTOUCH) 100 UNIT/ML FlexTouch Pen Inject 10 Units into the skin daily as needed (blood glucose over 200).    [provider]  insulin glargine, 2 Unit Dial, (TOUJEO MAX SOLOSTAR) 300 UNIT/ML Solostar Pen Inject 24 Units into the skin daily.     [provider]  lipase/protease/amylase (CREON) 36000 UNITS CPEP capsule Take 2 capsules with meals and 1 with snacks Patient not taking:  Reported on 09/29/2021 08/15/20   Unk Lightning, PA  naproxen sodium (ALEVE) 220 MG tablet Take 220 mg by mouth daily as needed (pain).    [provider]  traMADol (ULTRAM) 50 MG tablet Take 1 tablet (50 mg total) by mouth every 6 (six) hours as needed (for pain). Patient not taking: Reported on 09/29/2021 08/15/20   Unk Lightning, PA      Allergies    Patient has no known allergies.    Review of Systems   Review of Systems  Gastrointestinal:  Positive for abdominal pain.  All other systems reviewed and are negative.   Physical Exam Updated Vital Signs BP (!) 171/92   Pulse (!) 59   Temp 97.9 F (36.6 C) (Oral)   Resp 18   Ht 5\' 11"  (1.803 m)   Wt 95.3 kg   SpO2 100%   BMI 29.29 kg/m  Physical Exam Vitals and nursing note reviewed.  Constitutional:      General: He is not in acute distress.    Appearance: Normal appearance.  HENT:     Head: Normocephalic and atraumatic.  Eyes:     General:        Right eye: No discharge.        Left eye: No discharge.  Cardiovascular:     Rate and Rhythm: Normal rate and regular rhythm.     Heart sounds: No murmur heard.    No friction rub. No gallop.  Pulmonary:     Effort: Pulmonary effort is normal.     Breath sounds: Normal breath sounds.  Abdominal:     General: Bowel sounds are normal.     Palpations: Abdomen is soft.     Comments: Overall without significant tenderness to palpation in the abdomen although he does have some focal tenderness in the right upper quadrant, negative Murphy sign however.  Normal bowel sounds throughout.  Abdomen is nonrigid, nondistended.  Skin:    General: Skin is warm and dry.     Capillary Refill: Capillary refill takes less than 2 seconds.  Neurological:     Mental Status: He is alert and oriented to person, place, and time.  Psychiatric:        Mood and Affect: Mood normal.        Behavior: Behavior normal.     ED Results / Procedures / Treatments    Labs (all labs ordered are listed, but only abnormal results are displayed) Labs Reviewed  COMPREHENSIVE METABOLIC PANEL - Abnormal; Notable for the following components:      Result Value   Glucose, Bld 117 (*)    All other components within normal limits  LIPASE, BLOOD  CBC  URINALYSIS, ROUTINE W REFLEX MICROSCOPIC    EKG None  Radiology US Abdomen Limited RUQ (LIVER/GB) Result Date: 10/12/2023 CLINICAL DATA:  Right upper quadrant abdominal pain for 2 weeks. EXAM: ULTRASOUND ABDOMEN LIMITED RIGHT UPPER QUADRANT COMPARISON:  June 03, 2020. FINDINGS: Gallbladder: No gallstones or wall thickening visualized. No sonographic Murphy sign noted by sonographer. Common bile duct: Diameter: 3 mm which is within normal limits. Liver: No focal lesion identified. Increased echogenicity of hepatic parenchyma is noted suggesting hepatic steatosis. Portal vein is patent on color Doppler imaging with normal direction of blood flow towards the liver. Other: None. IMPRESSION: Increased echogenicity of hepatic parenchyma suggesting hepatic steatosis. Electronically Signed   By: Lupita Raider M.D.   On: 10/12/2023 09:34    Procedures Procedures    Medications Ordered in ED Medications  morphine (PF) 4 MG/ML injection 4 mg (4 mg Intravenous Given 10/12/23 0828)  alum & mag hydroxide-simeth (MAALOX/MYLANTA) 200-200-20 MG/5ML suspension 30 mL (30 mLs Oral Given 10/12/23 0828)    And  lidocaine (XYLOCAINE) 2 % viscous mouth solution 15 mL (15 mLs Oral Given 10/12/23 7829)    ED Course/ Medical Decision Making/ A&P                                 Medical Decision Making Amount and/or Complexity of Data Reviewed Labs: ordered. Radiology: ordered.  Risk OTC drugs. Prescription drug management.   This patient is a 62 y.o. male  who presents to the ED for concern of abd pain without nausea, vomiting.   Differential diagnoses prior to evaluation: The emergent differential diagnosis  includes, but is not limited to,  The causes of generalized abdominal pain include but are not limited to AAA, mesenteric ischemia, appendicitis, diverticulitis, DKA, gastritis, gastroenteritis, AMI, nephrolithiasis, pancreatitis, peritonitis, adrenal insufficiency,lead poisoning, iron toxicity, intestinal ischemia, constipation, UTI,SBO/LBO, splenic rupture, biliary disease, IBD, IBS, PUD, or hepatitis. This is not an exhaustive differential.   Past Medical History / Co-morbidities / Social History: Hyperlipidemia, chronic gastritis, pancreatitis  Additional history: Chart reviewed. Pertinent results include: Reviewed lab work, imaging from patient's recent previous emergency department visits, we have not seen him in the past several years, but he has been treated  for gastritis and pancreatitis previously on ED evaluation most recently in 2021  Physical Exam: Physical exam performed. The pertinent findings include: Some mild epigastric to right upper quadrant tenderness to palpation, no rebound, rigidity, guarding.  Vital signs stable in the ED other than hypertension, blood pressure 179/96 on arrival  Lab Tests/Imaging studies: I personally interpreted labs/imaging and the pertinent results include: CBC unremarkable, UA unremarkable, lipase normal, CMP unremarkable. RU Quadrant ultrasound shows some hepatic steatosis without any evidence of acute gallbladder pathology.. I agree with the radiologist interpretation.   Medications: I ordered medication including Maalox, Mylanta , Viscous lidocaine, and morphine.  I have reviewed the patients home medicines and have made adjustments as needed.   Disposition: After consideration of the diagnostic results and the patients response to treatment, I feel that patient with nonspecific intermittent epigastric pain, consistent with his chronic gastritis, will discharge with Protonix, encourage Maalox, Carafate, and GI follow-up.  Patient understands and  agrees to plan, is discharged in stable condition at this time.Marland Kitchen   emergency department workup does not suggest an emergent condition requiring admission or immediate intervention beyond what has been performed at this time. The plan is: as above. The patient is safe for discharge and has been instructed to return immediately for worsening symptoms, change in symptoms or any other concerns.  Final Clinical Impression(s) / ED Diagnoses Final diagnoses:  Chronic gastritis without bleeding, unspecified gastritis type    Rx / DC Orders ED Discharge Orders          Ordered    sucralfate (CARAFATE) 1 g tablet  3 times daily with meals & bedtime        10/12/23 0952    pantoprazole (PROTONIX) 40 MG tablet  Daily        10/12/23 0952              [place] 10/12/23 4098    Lorre Nick, MD 10/13/23 [id number]

## 2023-10-12 NOTE — ED Triage Notes (Signed)
Pt arrived via POV. C/o intermittent gen abd pain for past week. Hx of pancreatitis. Pain gets worse after eating.  AOx4

## 2023-10-12 NOTE — Discharge Instructions (Addendum)
I would take the Protonix medication I prescribed a daily you can use the other medication Carafate up to 3 times daily with meals.  Please contact the GI doctor whose contact permission provided above if you are not having significant relief of symptoms despite trying his medications.
